# Patient Record
Sex: Male | Born: 1975 | Race: White | Hispanic: No | Marital: Single | Smoking: Current some day smoker
Health system: Southern US, Community
[De-identification: ages and names within clinical notes are randomized; demographics above are authoritative.]

## PROBLEM LIST (undated history)

## (undated) DIAGNOSIS — I10 Essential (primary) hypertension: Secondary | ICD-10-CM

## (undated) DIAGNOSIS — C801 Malignant (primary) neoplasm, unspecified: Secondary | ICD-10-CM

## (undated) DIAGNOSIS — F32A Depression, unspecified: Secondary | ICD-10-CM

## (undated) DIAGNOSIS — T4145XA Adverse effect of unspecified anesthetic, initial encounter: Secondary | ICD-10-CM

## (undated) DIAGNOSIS — E2740 Unspecified adrenocortical insufficiency: Secondary | ICD-10-CM

## (undated) DIAGNOSIS — R112 Nausea with vomiting, unspecified: Secondary | ICD-10-CM

## (undated) DIAGNOSIS — C649 Malignant neoplasm of unspecified kidney, except renal pelvis: Secondary | ICD-10-CM

## (undated) DIAGNOSIS — F329 Major depressive disorder, single episode, unspecified: Secondary | ICD-10-CM

## (undated) DIAGNOSIS — Z9889 Other specified postprocedural states: Secondary | ICD-10-CM

## (undated) DIAGNOSIS — K219 Gastro-esophageal reflux disease without esophagitis: Secondary | ICD-10-CM

## (undated) DIAGNOSIS — G4730 Sleep apnea, unspecified: Secondary | ICD-10-CM

## (undated) DIAGNOSIS — E119 Type 2 diabetes mellitus without complications: Secondary | ICD-10-CM

## (undated) DIAGNOSIS — T8859XA Other complications of anesthesia, initial encounter: Secondary | ICD-10-CM

## (undated) HISTORY — DX: Depression, unspecified: F32.A

## (undated) HISTORY — PX: CHOLECYSTECTOMY: SHX55

## (undated) HISTORY — DX: Major depressive disorder, single episode, unspecified: F32.9

## (undated) HISTORY — DX: Unspecified adrenocortical insufficiency: E27.40

---

## 2002-05-06 ENCOUNTER — Encounter: Payer: Self-pay | Admitting: Family Medicine

## 2002-05-06 ENCOUNTER — Encounter: Admission: RE | Admit: 2002-05-06 | Discharge: 2002-05-06 | Payer: Self-pay | Admitting: Family Medicine

## 2009-11-30 ENCOUNTER — Encounter: Admission: RE | Admit: 2009-11-30 | Discharge: 2009-11-30 | Payer: Self-pay | Admitting: Family Medicine

## 2011-12-14 ENCOUNTER — Inpatient Hospital Stay: Payer: Self-pay | Admitting: Surgery

## 2011-12-14 LAB — COMPREHENSIVE METABOLIC PANEL
Albumin: 4.2 g/dL (ref 3.4–5.0)
Alkaline Phosphatase: 65 U/L (ref 50–136)
Anion Gap: 6 — ABNORMAL LOW (ref 7–16)
BUN: 11 mg/dL (ref 7–18)
Bilirubin,Total: 0.5 mg/dL (ref 0.2–1.0)
Creatinine: 1.03 mg/dL (ref 0.60–1.30)
EGFR (Non-African Amer.): >60
Glucose: 104 mg/dL — ABNORMAL HIGH (ref 65–99)
Osmolality: 281 (ref 275–301)
Potassium: 4.3 mmol/L (ref 3.5–5.1)
Sodium: 141 mmol/L (ref 136–145)
Total Protein: 7.6 g/dL (ref 6.4–8.2)

## 2011-12-14 LAB — LIPASE, BLOOD: Lipase: 124 U/L (ref 73–393)

## 2011-12-14 LAB — CBC
HGB: 14.7 g/dL (ref 13.0–18.0)
MCHC: 34.1 g/dL (ref 32.0–36.0)
Platelet: 296 10*3/uL (ref 150–440)
RBC: 4.77 10*6/uL (ref 4.40–5.90)

## 2011-12-15 LAB — CBC WITH DIFFERENTIAL/PLATELET
Basophil %: 0.1 %
Eosinophil #: 0 10*3/uL (ref 0.0–0.7)
HCT: 35.7 % — ABNORMAL LOW (ref 40.0–52.0)
HGB: 12.6 g/dL — ABNORMAL LOW (ref 13.0–18.0)
Lymphocyte #: 1 10*3/uL (ref 1.0–3.6)
MCH: 31.4 pg (ref 26.0–34.0)
MCHC: 35.2 g/dL (ref 32.0–36.0)
Monocyte #: 0.6 x10 3/mm (ref 0.2–1.0)
Neutrophil #: 7.4 10*3/uL — ABNORMAL HIGH (ref 1.4–6.5)

## 2011-12-15 LAB — BASIC METABOLIC PANEL
Anion Gap: 7 (ref 7–16)
BUN: 10 mg/dL (ref 7–18)
Chloride: 105 mmol/L (ref 98–107)
Co2: 24 mmol/L (ref 21–32)
Creatinine: 0.88 mg/dL (ref 0.60–1.30)
EGFR (Non-African Amer.): >60
Potassium: 4 mmol/L (ref 3.5–5.1)
Sodium: 136 mmol/L (ref 136–145)

## 2011-12-16 LAB — PATHOLOGY REPORT

## 2011-12-26 ENCOUNTER — Ambulatory Visit: Payer: Self-pay | Admitting: Surgery

## 2011-12-26 LAB — CBC WITH DIFFERENTIAL/PLATELET
Basophil #: 0.1 10*3/uL (ref 0.0–0.1)
Basophil %: 1.3 %
Eosinophil #: 0.3 10*3/uL (ref 0.0–0.7)
HGB: 13.4 g/dL (ref 13.0–18.0)
Lymphocyte %: 23 %
MCHC: 34.6 g/dL (ref 32.0–36.0)
Neutrophil %: 64.4 %
RDW: 12.8 % (ref 11.5–14.5)

## 2014-04-29 NOTE — Discharge Summary (Signed)
PATIENT NAME:  [name], [name] MR#:  [id number] DATE OF BIRTH:  [birth year]  DATE OF ADMISSION:  12/14/2011 DATE OF DISCHARGE:  12/16/2011   BRIEF HISTORY: Shawn Estrada is a 39 year old gentleman admitted with symptomatic biliary tract disease. He came to the Emergency Room with an increase in his symptoms over the last several days. Work-up suggested he may have chronic or possible subacute cholecystitis. He was taken to surgery on the afternoon of December 6th where he underwent laparoscopic cholecystectomy. He did have some bleeding complications at the bed of his liver that required significant dissection to control. A drain was placed and he did well after surgery. He did not have any significant problems post-surgery. He was up, active, tolerating a diet with no complaints. His wounds look good. There is no sign of any infection. His drain was removed. He is discharged home today to be followed in the office in 7 to 10 days' time. We have cautioned him about returning to work until he has followed up in the office. He was given Norco 5/325 p.o. q.4 to 6 hours p.r.n. pain.   FINAL DISCHARGE DIAGNOSIS: Acute cholecystitis.   PROCEDURE: Laparoscopic cholecystectomy.   ____________________________ Rodena Goldmann III, MD rle:drc D: 12/16/2011 10:20:00 ET T: 12/16/2011 12:43:53 ET JOB#: [id number]  cc: Rodena Goldmann III, MD, <Dictator> Rodena Goldmann MD ELECTRONICALLY SIGNED 12/23/2011 16:58

## 2014-04-29 NOTE — H&P (Signed)
Subjective/Chief Complaint Epigastric and RUQ pain x 6 hours, emesis x 3 times    History of Present Illness Mr. Toso is a healthy 39 yo M with a history of acute onset epigastric and RUQ pain. He says that his pain began suddenly approx 10 pm.  He describes it as colicky and intermittent.  Has never had pain like this before.  Has had some episodes of emesis which were secondary to pain.  Father had gallstone disease and had to have his gallbladder out.  Last bm today at 8 pm and was normal.  No fevers, chills, night sweats, shortness of breath, cough, chest pain, dysuria/hematuria.  Ultrasound shows multiple gallstones, nonmobile in neck of gallbladder, no gb wall thickening, no pericholecystic fluid, cbd nl at 5.4 mm    Past History History of oral surgery   Past Med/Surgical Hx:  denies medical/surgical history:   ALLERGIES:  No Known Allergies:   Family and Social History:   Family History Negative  F/H of gallstones.  No CAD/DM/HTN    Social History positive  tobacco, positive ETOH, negative Illicit drugs, Social Etoh, last 2 beers 2 days PTA    + Tobacco Current (within 1 year)  Used smokeless tobacco daily    Place of Living Home  Here with his girlfriend, lives in [place]   Review of Systems:   Subjective/Chief Complaint colicky RUQ/epigastric pain    Fever/Chills No    Cough No    Sputum No    Abdominal Pain Yes    Diarrhea No    Constipation No    Nausea/Vomiting Yes    SOB/DOE No    Chest Pain No    Dysuria No    Medications/Allergies Reviewed Medications/Allergies reviewed   Physical Exam:   GEN no acute distress, Alert and oriented x 3    HEENT pink conjunctivae, PERRL, hearing intact to voice, moist oral mucosa, good dentition    NECK supple  No masses    RESP normal resp effort  clear BS  no use of accessory muscles    CARD regular rate  no murmur  no thrills  No LE edema    ABD denies tenderness  denies Flank Tenderness  no  liver/spleen enlargement  no hernia  soft  normal BS  no Adominal Mass    EXTR negative cyanosis/clubbing, negative edema    SKIN normal to palpation, No rashes, No ulcers, skin turgor good    NEURO cranial nerves intact, negative rigidity, negative tremor, follows commands, strength:, motor/sensory function intact    PSYCH alert, A+O to time, place, person, good insight   Lab Results: Hepatic:  04-Dec-13 02:12    Bilirubin, Total 0.5   Alkaline Phosphatase 65   SGPT (ALT) 35   SGOT (AST) 24   Total Protein, Serum 7.6   Albumin, Serum 4.2  Routine Chem:  04-Dec-13 02:12    Glucose, Serum  104   BUN 11   Creatinine (comp) 1.03   Sodium, Serum 141   Potassium, Serum 4.3   Chloride, Serum 107   CO2, Serum 28   Calcium (Total), Serum 9.2   Osmolality (calc) 281   eGFR (African American) >60   eGFR (Non-African American) >60 (eGFR values <33m/min/1.73 m2 may be an indication of chronic kidney disease (CKD). Calculated eGFR is useful in patients with stable renal function. The eGFR calculation will not be reliable in acutely ill patients when serum creatinine is changing rapidly. It is not useful in  patients  on dialysis. The eGFR calculation may not be applicable to patients at the low and high extremes of body sizes, pregnant women, and vegetarians.)   Anion Gap  6   Lipase 124 (Result(s) reported on 14 Dec 2011 at 02:45AM.)  Routine Hem:  04-Dec-13 02:12    WBC (CBC) 7.7   RBC (CBC) 4.77   Hemoglobin (CBC) 14.7   Hematocrit (CBC) 43.1   Platelet Count (CBC) 296 (Result(s) reported on 14 Dec 2011 at 02:35AM.)   MCV 91   MCH 30.8   MCHC 34.1   RDW 12.8     Assessment/Admission Diagnosis Mr. Slape is a pleasant 39 yo M with colicky RUQ and epigastric pain.  Gallstones on ultrasound with nonmobile stones at neck.  Likely biliary colic.    Plan Will admit for pain control.  Will discuss possible cholecystectomy with my colleague, Dr. Pat Patrick.   Electronic  Signatures: Floyde Parkins (MD)  (Signed 04-Dec-13 04:17)  Authored: CHIEF COMPLAINT and HISTORY, PAST MEDICAL/SURGIAL HISTORY, ALLERGIES, FAMILY AND SOCIAL HISTORY, REVIEW OF SYSTEMS, PHYSICAL EXAM, LABS, ASSESSMENT AND PLAN   Last Updated: 04-Dec-13 04:17 by Floyde Parkins (MD)

## 2014-04-29 NOTE — Op Note (Signed)
PATIENT NAME:  Shawn Estrada, Shawn Estrada MR#:  [id number] DATE OF BIRTH:  22-Jun-1975  DATE OF PROCEDURE:  12/14/2011  PREOPERATIVE DIAGNOSIS: Chronic cholecystitis and cholelithiasis.   POSTOPERATIVE DIAGNOSIS: Chronic cholecystitis and cholelithiasis.   PROCEDURE: Laparoscopic cholecystectomy.   SURGEON: Rodena Goldmann III, MD  ASSISTANT: Terence Lux, PA student   ANESTHESIA: General.   OPERATIVE PROCEDURE: With the patient in supine position after induction of appropriate general anesthesia, the patient's abdomen was prepped with ChloraPrep and draped with sterile towels. The patient was placed in head down, feet up position. A small infraumbilical incision was made in the standard fashion, carried down bluntly through the subcutaneous tissue. The Veress needle was used to cannulate the peritoneal cavity. CO2 was insufflated to appropriate pressure measurements. When approximately 2.5 liters of CO2 were instilled Veress needle was withdrawn. An 11 mm [employer] port inserted into the peritoneal cavity. Intraperitoneal position was confirmed. CO2 was reinsufflated. The patient was placed head up, feet down position, rotated slightly to the left side. A subxiphoid transverse incision was made and an 11 mm port inserted under direct vision. Two lateral ports 5 mm in size inserted under direct vision. The gallbladder was contracted, thickened, obviously chronically inflamed with evidence of edema and acute inflammation in the inferior portion. Gallbladder was retracted superiorly and laterally exposing the hepatoduodenal ligament. Cystic artery and cystic duct were identified. Cystic duct was small with a large adjacent pericystic artery duct. The duct was clipped proximally and opened. An on table cholangiogram could not be performed because the catheter could not be inserted past the valves into the distal duct. Anatomy was confirmed. Cystic duct doubly clipped on the common duct side and divided. Cystic artery  was identified, doubly clipped and divided. The gallbladder was dissected free from its bed and delivered. I got into the gallbladder twice both times capturing large marble size stones and removing them. No stones were lost. The midportion of the gallbladder, there was a large arterial branch which bled profusely. Took several minutes to affect control. It was clipped and controlled with a moderate amount of difficulty but satisfactorily. The gallbladder was continued to be removed using Bovie electrocautery. It was captured with an Endo Catch apparatus, removed through subxiphoid port. The area was copiously irrigated. A Blake drain was placed at the base of the liver, brought out through a separate stab wound, secured with 3-0 nylon. The abdomen was desufflated. The incision had been closed with a suture passer and 0 Vicryl. Skin was closed with 5-0 and 3-0 nylon and sterile dressings applied. The patient returned to the recovery room having tolerated procedure well. Sponge, instrument, and needle counts were correct x2 in the Operating Room.   ____________________________ Rodena Goldmann III, MD rle:cms D: 12/14/2011 16:52:24 ET T: 12/14/2011 17:51:05 ET JOB#: [id number]  cc: Rodena Goldmann III, MD, <Dictator> Rodena Goldmann MD ELECTRONICALLY SIGNED 12/15/2011 17:10

## 2014-09-26 ENCOUNTER — Ambulatory Visit (INDEPENDENT_AMBULATORY_CARE_PROVIDER_SITE_OTHER): Payer: 59 | Admitting: Licensed Clinical Social Worker

## 2014-09-26 DIAGNOSIS — F39 Unspecified mood [affective] disorder: Secondary | ICD-10-CM
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
## 2014-10-24 ENCOUNTER — Ambulatory Visit (INDEPENDENT_AMBULATORY_CARE_PROVIDER_SITE_OTHER): Payer: 59 | Admitting: Psychiatry

## 2014-10-24 ENCOUNTER — Encounter: Payer: Self-pay | Admitting: Psychiatry

## 2014-10-24 VITALS — BP 140/84 | HR 96 | Temp 98.4°F | Ht 74.0 in | Wt 261.6 lb

## 2014-10-24 DIAGNOSIS — F603 Borderline personality disorder: Secondary | ICD-10-CM | POA: Diagnosis not present

## 2014-10-24 NOTE — Patient Instructions (Addendum)
Will work on referral to anger management and clinic will contact and discuss this with you.
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
## 2014-11-07 ENCOUNTER — Ambulatory Visit (INDEPENDENT_AMBULATORY_CARE_PROVIDER_SITE_OTHER): Payer: 59 | Admitting: Licensed Clinical Social Worker

## 2014-11-07 DIAGNOSIS — F603 Borderline personality disorder: Secondary | ICD-10-CM | POA: Diagnosis not present

## 2014-11-12 NOTE — Progress Notes (Signed)
   THERAPIST PROGRESS NOTE  Session Time: 68min  Participation Level: Active  Behavioral Response: CasualAlertAppropriate  Type of Therapy: Individual Therapy  Treatment Goals addressed: Coping  Interventions: CBT, Motivational Interviewing, Solution Focused, Supportive, Family Systems and Reframing  Summary: Shawn Estrada is a 39 y.o. male who presents with continued symptoms of his diagnosis. He reports that he spoke with friends and family to learn about his anger and aggression.  He states that his family reports that his anger has reduced over the years.  Explored coping skills and communication styles.  Explored anger management groups.  Suicidal/Homicidal: Nowithout intent/plan  Therapist Response: Assessed mood and explored stressors.  Explored coping skills and assisted with understanding them.  Plan: Return again in 4 weeks.  Diagnosis: Axis I: Explosive Personality Disorder    Axis II: No diagnosis    Lubertha South 11/12/2014

## 2014-12-03 ENCOUNTER — Ambulatory Visit: Payer: 59 | Admitting: Psychiatry

## 2014-12-10 ENCOUNTER — Ambulatory Visit: Payer: 59 | Admitting: Psychiatry

## 2014-12-12 ENCOUNTER — Ambulatory Visit: Payer: Self-pay | Admitting: Licensed Clinical Social Worker

## 2015-01-30 ENCOUNTER — Ambulatory Visit (INDEPENDENT_AMBULATORY_CARE_PROVIDER_SITE_OTHER): Payer: 59 | Admitting: Licensed Clinical Social Worker

## 2015-01-30 ENCOUNTER — Encounter: Payer: Self-pay | Admitting: Psychiatry

## 2015-01-30 ENCOUNTER — Ambulatory Visit (INDEPENDENT_AMBULATORY_CARE_PROVIDER_SITE_OTHER): Payer: 59 | Admitting: Psychiatry

## 2015-01-30 VITALS — BP 140/88 | HR 104 | Temp 97.4°F | Ht 74.0 in | Wt 260.0 lb

## 2015-01-30 DIAGNOSIS — F603 Borderline personality disorder: Secondary | ICD-10-CM

## 2015-01-30 NOTE — Progress Notes (Signed)
BH MD/PA/NP OP Progress Note  01/30/2015 8:58 AM Shawn Estrada  MRN:  [id number]  Subjective:  Asian returns for follow-up of his intermittent explosive disorder. He states overall things are been going well. He states that he has been catching himself in certain situations where he would get angry (i.e.) is with his girlfriend) ease and walking away. He states is explained her that he will need to do this in certain situations. He states that overall his work well. We spent time discussing that there is no FDA approved medicine for anger, however there have been some studies that have shown SSRIs or mood stabilizers (i.e. Carbamazepine) might be effective. Ever the main treatment would be therapy. Patient is not interested in medications and would like to avoid them. Given that he feels like he is made some behavioral interventions which have been helpful to defer starting any medications at this time. He is aware that should the therapy not be effective or his behaviors worsen he can always come back and try medication.  He states he is able to enjoy things but has been working a lot of hours at work recently to being on call. Discusses drinking and he states it's minimal to none. He states his last time drinking was New Year's Eve when he had a few beers and champagne. Chief Complaint: Doing okay Chief Complaint    Follow-up     Visit Diagnosis:  No diagnosis found.  Past Medical History:  Past Medical History  Diagnosis Date  . Anxiety   . Depression     Past Surgical History  Procedure Laterality Date  . Cholecystectomy     Family History:  Family History  Problem Relation Age of Onset  . Depression Sister    Social History:  Social History   Social History  . Marital Status: Married    Spouse Name: N/A  . Number of Children: N/A  . Years of Education: N/A   Social History Main Topics  . Smoking status: Current Some Day Smoker -- 0.50 packs/day    Types: Cigarettes     Start date: 10/23/1989  . Smokeless tobacco: Current User    Types: Snuff  . Alcohol Use: No  . Drug Use: No  . Sexual Activity: Yes    Birth Control/ Protection: None   Other Topics Concern  . None   Social History Narrative   Additional History:   Assessment:   Musculoskeletal: Strength & Muscle Tone: within normal limits Gait & Station: normal Patient leans: N/A  Psychiatric Specialty Exam: HPI  Review of Systems  Psychiatric/Behavioral: Negative for depression, suicidal ideas, hallucinations, memory loss and substance abuse. The patient is not nervous/anxious and does not have insomnia.   All other systems reviewed and are negative.   Blood pressure 140/88, pulse 104, temperature 97.4 F (36.3 C), temperature source Tympanic, height 6\' 2"  (1.88 m), weight 260 lb (117.935 kg), SpO2 96 %.Body mass index is 33.37 kg/(m^2).  General Appearance: Well Groomed  Eye Contact:  Good  Speech:  Normal Rate  Volume:  Normal  Mood:  Good  Affect:  Congruent  Thought Process:  Linear and Logical  Orientation:  Full (Time, Place, and Person)  Thought Content:  Negative  Suicidal Thoughts:  No  Homicidal Thoughts:  No  Memory:  Immediate;   Good Recent;   Good Remote;   Good  Judgement:  Good  Insight:  Good  Psychomotor Activity:  Normal  Concentration:  Good  Recall:  Good  Fund of Knowledge: Good  Language: Good  Akathisia:  No  Handed:    AIMS (if indicated):    Assets:  Communication Skills Desire for Improvement Social Support Vocational/Educational  ADL's:  Intact  Cognition: WNL  Sleep:  Good    Is the patient at risk to self?  No. Has the patient been a risk to self in the past 6 months?  No. Has the patient been a risk to self within the distant past?  No. Is the patient a risk to others?  No. Has the patient been a risk to others in the past 6 months?  No. Has the patient been a risk to others within the distant past?  No.  Current  Medications: Current Outpatient Prescriptions  Medication Sig Dispense Refill  . omeprazole (PRILOSEC) 20 MG capsule Take 20 mg by mouth daily.     No current facility-administered medications for this visit.    Medical Decision Making:  Established Problem, Stable/Improving (1)  Treatment Plan Summary:Medication management and Plan   Intermittent Explosive Disorder -patient continues to function on his job and has not had any issues with harm to self or others. He states most recently he's made an effort to be aware of his feelings and walk away if he feels like his emotions are escalating to anger. He is continuing in therapy at this time. He would prefer not to start any medications and given he discusses he has been able to use behavioral intervention to stop his anger we are not going to start medication at this time.Rene Paci made him aware of some options such as SSRIs should he feel like he wants medications in addition to therapy.   Faith Rogue 01/30/2015, 8:58 AM

## 2015-02-09 NOTE — Progress Notes (Signed)
   THERAPIST PROGRESS NOTE  Session Time:34min  Participation Level: Active  Behavioral Response: CasualAlertAppropriate  Type of Therapy: Individual Therapy  Treatment Goals addressed: Coping  Interventions: CBT, Motivational Interviewing, Solution Focused, Supportive and Reframing  Summary: Shawn Estrada is a 40 y.o. male who presents with a reduction in his current symptoms.  Explored his behavior and aggressive incidents the past few months.  Discussion of stressors that he has noticed in the past few months. Explored coping skills that have been useful.  Evaluated coping skills used and explored new techniques.  Discussed imagery and developed coping skills to use if someone follows him as he attempts to walk away.  Suicidal/Homicidal: Nowithout intent/plan  Therapist Response: LCSW provided Patient with ongoing emotional support and encouragement.  Normalized his feelings.  Commended Patient on his progress and reinforced the importance of client staying focused on his own strengths and resources and resiliency. Processed various strategies for dealing with stressors.    Plan: Return again PRN.  Diagnosis: Axis I: Explosive Personality Disorder    Axis II: No diagnosis    Lubertha South 01/30/2015

## 2015-08-31 ENCOUNTER — Ambulatory Visit
Admission: RE | Admit: 2015-08-31 | Discharge: 2015-08-31 | Disposition: A | Discharge status: Home / Self Care | Payer: Commercial Managed Care - HMO | Source: Ambulatory Visit | Attending: Physician Assistant | Admitting: Physician Assistant

## 2015-08-31 ENCOUNTER — Other Ambulatory Visit: Payer: Self-pay | Admitting: Physician Assistant

## 2015-08-31 DIAGNOSIS — R0602 Shortness of breath: Secondary | ICD-10-CM

## 2015-09-30 ENCOUNTER — Encounter: Payer: Self-pay | Admitting: Physician Assistant

## 2015-10-01 ENCOUNTER — Encounter (INDEPENDENT_AMBULATORY_CARE_PROVIDER_SITE_OTHER): Payer: Commercial Managed Care - HMO

## 2015-10-01 DIAGNOSIS — R002 Palpitations: Secondary | ICD-10-CM

## 2015-12-23 ENCOUNTER — Ambulatory Visit (INDEPENDENT_AMBULATORY_CARE_PROVIDER_SITE_OTHER): Payer: 59 | Admitting: Licensed Clinical Social Worker

## 2015-12-23 DIAGNOSIS — F321 Major depressive disorder, single episode, moderate: Secondary | ICD-10-CM | POA: Diagnosis not present

## 2015-12-24 ENCOUNTER — Telehealth: Payer: Self-pay

## 2015-12-24 NOTE — Telephone Encounter (Signed)
I returned his call.  His appointment is Tuesday the 19th with Dr. Carolynn Sayers

## 2015-12-24 NOTE — Progress Notes (Signed)
   THERAPIST PROGRESS NOTE  Session Time: 60 min  Participation Level: Active  Behavioral Response: Casual and NeatAlertDepressed  Type of Therapy: Individual Therapy  Treatment Goals addressed: Coping  Interventions: CBT, Motivational Interviewing, Solution Focused, Strength-based, Supportive and Reframing  Summary: Shawn Estrada is a 40 y.o. male who presents with an increase in his mood/behavior.   Patient reports that he has not been doing well emotionally. Patient reports continuous change in his mood, anger and emotions since his last session, several months ago.  Patient states that he and his girlfriend broke up about 2 months ago.  Patient states that he has tried to reduce anger outburst by walking away and telling himself it's not worth it.  Patient reports that he was provoked at work and he followed the chain of command.  Patient states that nothing happened were no changes were made in his work environment.  Patient states that he isolates is tearful, isolates self, lack of motivation, inability to sleep and fatigue.  Discussion of an appointment with a psychiatrist and possible referral to mental health intensive outpatient program in [place].  Patient reports that he would like to attend the intensive outpatient program, but works Monday through Friday 830 to 5:30. Discussion of coping skills and relaxation techniques.  Suicidal/Homicidal: No  Therapist Response: Assessed pt current functioning per pt report.  Focused on rapport building w/ pt and exploring w/ pt his tx hx and what he is hoping to accomplish in treatment.  Discussed current symptoms and focused on pt utilizing his strengths w/ typical struggles w/ winter months ahead.  Referral to [facility] & Dr. Adele Schilder.  Plan: After MH IOP.  Diagnosis: Axis I: Depression    Axis II: No diagnosis    Lubertha South, LCSW 12/24/2015

## 2015-12-24 NOTE — Telephone Encounter (Signed)
pt would like to find out if you got him set up with someone for medication managment.

## 2015-12-28 ENCOUNTER — Ambulatory Visit: Payer: 59 | Admitting: Licensed Clinical Social Worker

## 2015-12-29 ENCOUNTER — Encounter (HOSPITAL_COMMUNITY): Payer: Self-pay | Admitting: Psychiatry

## 2015-12-29 ENCOUNTER — Ambulatory Visit (INDEPENDENT_AMBULATORY_CARE_PROVIDER_SITE_OTHER): Payer: Commercial Managed Care - HMO | Admitting: Psychiatry

## 2015-12-29 VITALS — BP 146/92 | HR 86 | Ht 74.0 in | Wt 235.6 lb

## 2015-12-29 DIAGNOSIS — F331 Major depressive disorder, recurrent, moderate: Secondary | ICD-10-CM

## 2015-12-29 DIAGNOSIS — Z79899 Other long term (current) drug therapy: Secondary | ICD-10-CM | POA: Diagnosis not present

## 2015-12-29 MED ORDER — LAMOTRIGINE 25 MG PO TABS: ORAL_TABLET | ORAL | 1 refills | Status: DC
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
## 2016-01-13 ENCOUNTER — Ambulatory Visit (INDEPENDENT_AMBULATORY_CARE_PROVIDER_SITE_OTHER): Payer: 59 | Admitting: Licensed Clinical Social Worker

## 2016-01-13 DIAGNOSIS — F331 Major depressive disorder, recurrent, moderate: Secondary | ICD-10-CM

## 2016-01-14 ENCOUNTER — Ambulatory Visit: Payer: 59 | Admitting: Psychiatry

## 2016-01-21 NOTE — Progress Notes (Signed)
   THERAPIST PROGRESS NOTE  Session Time: 59min  Participation Level: Active  Behavioral Response: NeatAlertDepressed  Type of Therapy: Individual Therapy  Treatment Goals addressed: Coping  Interventions: CBT, Motivational Interviewing, Solution Focused, Strength-based, Supportive and Reframing  Summary: Shawn Estrada is a 41 y.o. male who presents with continued symptoms of his diagnosis.  Patient reports that he is doing poor with his mood.  Patient reports that he isolates himself and gets upsert (crying, sadness). When he thinks about his previous girlfriend.  Patient reports that he goes to work and goes home daily.  He reports that he was able to visit with his girlfriend for a few days and now she wants him to leave her home.  Patient is fixated on discussing his relationship and how things went wrong.  Patient reports that he wants the relationship.  Discussed pros and cons of the relationship and life after the relationship.  Assisted with developing coping skills to handle current mood.   Suicidal/Homicidal: No  Therapist Response: Assessed pt current functioning per pt report.  Explored w/ pt contributing factors to increased depressed mood.  Discussed current symptoms and focused on pt utilizing his strengths w/ typical..  Developed tx plan   Plan: Return again in 2 weeks.  Diagnosis: Axis I: Major Depression, Recurrent severe    Axis II: No diagnosis    Lubertha South, LCSW 01/15/16

## 2016-01-29 ENCOUNTER — Ambulatory Visit: Payer: 59 | Admitting: Licensed Clinical Social Worker

## 2016-02-05 ENCOUNTER — Ambulatory Visit: Payer: 59 | Admitting: Licensed Clinical Social Worker

## 2016-02-12 ENCOUNTER — Encounter (HOSPITAL_COMMUNITY): Payer: Self-pay | Admitting: Psychiatry

## 2016-02-12 ENCOUNTER — Ambulatory Visit (INDEPENDENT_AMBULATORY_CARE_PROVIDER_SITE_OTHER): Payer: Commercial Managed Care - HMO | Admitting: Psychiatry

## 2016-02-12 VITALS — BP 150/96 | HR 97 | Ht 74.0 in | Wt 235.0 lb

## 2016-02-12 DIAGNOSIS — Z818 Family history of other mental and behavioral disorders: Secondary | ICD-10-CM

## 2016-02-12 DIAGNOSIS — F17210 Nicotine dependence, cigarettes, uncomplicated: Secondary | ICD-10-CM

## 2016-02-12 DIAGNOSIS — Z9049 Acquired absence of other specified parts of digestive tract: Secondary | ICD-10-CM | POA: Diagnosis not present

## 2016-02-12 DIAGNOSIS — F331 Major depressive disorder, recurrent, moderate: Secondary | ICD-10-CM | POA: Diagnosis not present

## 2016-02-12 DIAGNOSIS — Z79899 Other long term (current) drug therapy: Secondary | ICD-10-CM

## 2016-02-12 MED ORDER — LAMOTRIGINE 100 MG PO TABS: 100.0000 mg | ORAL_TABLET | Freq: Every day | ORAL | 1 refills | Status: DC
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
## 2016-02-18 DIAGNOSIS — N451 Epididymitis: Secondary | ICD-10-CM | POA: Diagnosis not present

## 2016-02-24 ENCOUNTER — Other Ambulatory Visit (HOSPITAL_COMMUNITY): Payer: Self-pay | Admitting: Psychiatry

## 2016-02-24 DIAGNOSIS — F331 Major depressive disorder, recurrent, moderate: Secondary | ICD-10-CM

## 2016-02-26 ENCOUNTER — Ambulatory Visit (INDEPENDENT_AMBULATORY_CARE_PROVIDER_SITE_OTHER): Payer: 59 | Admitting: Licensed Clinical Social Worker

## 2016-02-26 DIAGNOSIS — F331 Major depressive disorder, recurrent, moderate: Secondary | ICD-10-CM | POA: Diagnosis not present

## 2016-03-04 ENCOUNTER — Other Ambulatory Visit (HOSPITAL_COMMUNITY): Payer: Self-pay | Admitting: Psychiatry

## 2016-03-04 DIAGNOSIS — Z79899 Other long term (current) drug therapy: Secondary | ICD-10-CM | POA: Diagnosis not present
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
## 2016-03-05 LAB — CBC WITH DIFFERENTIAL/PLATELET
Basophils Absolute: 0.1 10*3/uL (ref 0.0–0.2)
Basos: 1 %
EOS (ABSOLUTE): 0.2 10*3/uL (ref 0.0–0.4)
EOS: 3 %
HEMATOCRIT: 42.6 % (ref 37.5–51.0)
Hemoglobin: 14.3 g/dL (ref 13.0–17.7)
Immature Grans (Abs): 0 10*3/uL (ref 0.0–0.1)
Immature Granulocytes: 0 %
LYMPHS ABS: 2 10*3/uL (ref 0.7–3.1)
Lymphs: 37 %
MCH: 29.1 pg (ref 26.6–33.0)
MCHC: 33.6 g/dL (ref 31.5–35.7)
MCV: 87 fL (ref 79–97)
MONOS ABS: 0.3 10*3/uL (ref 0.1–0.9)
Monocytes: 6 %
Neutrophils Absolute: 2.9 10*3/uL (ref 1.4–7.0)
Neutrophils: 53 %
Platelets: 279 10*3/uL (ref 150–379)
RBC: 4.91 x10E6/uL (ref 4.14–5.80)
RDW: 13 % (ref 12.3–15.4)
WBC: 5.4 10*3/uL (ref 3.4–10.8)

## 2016-03-05 LAB — CMP14+EGFR
A/G RATIO: 1.8 (ref 1.2–2.2)
ALBUMIN: 4.6 g/dL (ref 3.5–5.5)
ALK PHOS: 77 IU/L (ref 39–117)
ALT: 17 IU/L (ref 0–44)
AST: 17 IU/L (ref 0–40)
BILIRUBIN TOTAL: 0.4 mg/dL (ref 0.0–1.2)
BUN / CREAT RATIO: 10 (ref 9–20)
BUN: 11 mg/dL (ref 6–24)
CO2: 22 mmol/L (ref 18–29)
CREATININE: 1.14 mg/dL (ref 0.76–1.27)
Calcium: 9.1 mg/dL (ref 8.7–10.2)
Chloride: 103 mmol/L (ref 96–106)
GFR calc Af Amer: 91 mL/min/{1.73_m2} (ref >59)
GFR calc non Af Amer: 79 mL/min/{1.73_m2} (ref >59)
GLOBULIN, TOTAL: 2.6 g/dL (ref 1.5–4.5)
Glucose: 82 mg/dL (ref 65–99)
POTASSIUM: 4.5 mmol/L (ref 3.5–5.2)
SODIUM: 142 mmol/L (ref 134–144)
Total Protein: 7.2 g/dL (ref 6.0–8.5)

## 2016-03-05 LAB — TSH: TSH: 0.81 u[IU]/mL (ref 0.450–4.500)

## 2016-03-05 LAB — HGB A1C W/O EAG: Hgb A1c MFr Bld: 5.2 % (ref 4.8–5.6)

## 2016-03-18 ENCOUNTER — Ambulatory Visit (INDEPENDENT_AMBULATORY_CARE_PROVIDER_SITE_OTHER): Payer: 59 | Admitting: Licensed Clinical Social Worker

## 2016-03-18 DIAGNOSIS — F331 Major depressive disorder, recurrent, moderate: Secondary | ICD-10-CM
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
## 2016-03-26 ENCOUNTER — Emergency Department: Payer: 59

## 2016-03-26 ENCOUNTER — Emergency Department
Admission: EM | Admit: 2016-03-26 | Discharge: 2016-03-26 | Disposition: A | Discharge status: Home / Self Care | Payer: 59 | Attending: Emergency Medicine | Admitting: Emergency Medicine

## 2016-03-26 ENCOUNTER — Encounter: Payer: Self-pay | Admitting: Medical Oncology

## 2016-03-26 DIAGNOSIS — R109 Unspecified abdominal pain: Secondary | ICD-10-CM | POA: Diagnosis not present

## 2016-03-26 DIAGNOSIS — F17210 Nicotine dependence, cigarettes, uncomplicated: Secondary | ICD-10-CM | POA: Diagnosis not present

## 2016-03-26 DIAGNOSIS — I10 Essential (primary) hypertension: Secondary | ICD-10-CM | POA: Insufficient documentation

## 2016-03-26 DIAGNOSIS — Z79899 Other long term (current) drug therapy: Secondary | ICD-10-CM | POA: Insufficient documentation

## 2016-03-26 DIAGNOSIS — F17290 Nicotine dependence, other tobacco product, uncomplicated: Secondary | ICD-10-CM | POA: Insufficient documentation

## 2016-03-26 DIAGNOSIS — R110 Nausea: Secondary | ICD-10-CM | POA: Diagnosis not present

## 2016-03-26 DIAGNOSIS — R918 Other nonspecific abnormal finding of lung field: Secondary | ICD-10-CM | POA: Diagnosis not present

## 2016-03-26 DIAGNOSIS — R339 Retention of urine, unspecified: Secondary | ICD-10-CM | POA: Diagnosis not present

## 2016-03-26 DIAGNOSIS — R1031 Right lower quadrant pain: Secondary | ICD-10-CM | POA: Diagnosis not present

## 2016-03-26 DIAGNOSIS — N2889 Other specified disorders of kidney and ureter: Secondary | ICD-10-CM | POA: Insufficient documentation

## 2016-03-26 DIAGNOSIS — R319 Hematuria, unspecified: Secondary | ICD-10-CM | POA: Diagnosis present

## 2016-03-26 HISTORY — DX: Essential (primary) hypertension: I10

## 2016-03-26 LAB — CBC WITH DIFFERENTIAL/PLATELET
Basophils Absolute: 0.1 10*3/uL (ref 0–0.1)
Basophils Relative: 1 %
Eosinophils Absolute: 0.2 10*3/uL (ref 0–0.7)
Eosinophils Relative: 3 %
HEMATOCRIT: 42.5 % (ref 40.0–52.0)
HEMOGLOBIN: 14.6 g/dL (ref 13.0–18.0)
LYMPHS ABS: 1.9 10*3/uL (ref 1.0–3.6)
Lymphocytes Relative: 23 %
MCH: 29.8 pg (ref 26.0–34.0)
MCHC: 34.4 g/dL (ref 32.0–36.0)
MCV: 86.8 fL (ref 80.0–100.0)
MONOS PCT: 8 %
Monocytes Absolute: 0.7 10*3/uL (ref 0.2–1.0)
NEUTROS ABS: 5.4 10*3/uL (ref 1.4–6.5)
NEUTROS PCT: 65 %
Platelets: 328 10*3/uL (ref 150–440)
RBC: 4.9 MIL/uL (ref 4.40–5.90)
RDW: 13.1 % (ref 11.5–14.5)
WBC: 8.3 10*3/uL (ref 3.8–10.6)

## 2016-03-26 LAB — BASIC METABOLIC PANEL
Anion gap: 9 (ref 5–15)
BUN: 14 mg/dL (ref 6–20)
CHLORIDE: 102 mmol/L (ref 101–111)
CO2: 26 mmol/L (ref 22–32)
CREATININE: 1.33 mg/dL — AB (ref 0.61–1.24)
Calcium: 9.1 mg/dL (ref 8.9–10.3)
GFR calc non Af Amer: >60 mL/min (ref >60)
GLUCOSE: 103 mg/dL — AB (ref 65–99)
Potassium: 4.1 mmol/L (ref 3.5–5.1)
Sodium: 137 mmol/L (ref 135–145)

## 2016-03-26 LAB — URINALYSIS, COMPLETE (UACMP) WITH MICROSCOPIC
BACTERIA UA: NONE SEEN
SPECIFIC GRAVITY, URINE: 1.02 (ref 1.005–1.030)
Squamous Epithelial / HPF: NONE SEEN

## 2016-03-26 MED ORDER — SODIUM CHLORIDE 0.9 % IV BOLUS (SEPSIS)
1000.0000 mL | Freq: Once | INTRAVENOUS | Status: AC
  Administered 2016-03-26: 1000 mL via INTRAVENOUS

## 2016-03-26 MED ORDER — CIPROFLOXACIN HCL 500 MG PO TABS
500.0000 mg | ORAL_TABLET | Freq: Once | ORAL | Status: AC
  Administered 2016-03-26: 500 mg via ORAL
  Filled 2016-03-26: qty 1

## 2016-03-26 MED ORDER — CIPROFLOXACIN HCL 500 MG PO TABS: 500.0000 mg | ORAL_TABLET | Freq: Two times a day (BID) | ORAL | 0 refills | Status: DC

## 2016-03-26 MED ORDER — MORPHINE SULFATE (PF) 4 MG/ML IV SOLN
4.0000 mg | Freq: Once | INTRAVENOUS | Status: AC
  Administered 2016-03-26: 4 mg via INTRAVENOUS

## 2016-03-26 MED ORDER — MORPHINE SULFATE (PF) 4 MG/ML IV SOLN
INTRAVENOUS | Status: AC
  Filled 2016-03-26: qty 1

## 2016-03-26 MED ORDER — OXYCODONE-ACETAMINOPHEN 5-325 MG PO TABS: 1.0000 | ORAL_TABLET | Freq: Four times a day (QID) | ORAL | 0 refills | Status: DC | PRN

## 2016-03-26 MED ORDER — OXYCODONE-ACETAMINOPHEN 5-325 MG PO TABS
2.0000 | ORAL_TABLET | Freq: Once | ORAL | Status: AC
  Administered 2016-03-26: 2 via ORAL
  Filled 2016-03-26: qty 2

## 2016-03-26 MED ORDER — KETOROLAC TROMETHAMINE 30 MG/ML IJ SOLN
30.0000 mg | Freq: Once | INTRAMUSCULAR | Status: AC
  Administered 2016-03-26: 30 mg via INTRAVENOUS
  Filled 2016-03-26: qty 1

## 2016-03-26 MED ORDER — IOPAMIDOL (ISOVUE-300) INJECTION 61%
100.0000 mL | Freq: Once | INTRAVENOUS | Status: AC | PRN
  Administered 2016-03-26: 100 mL via INTRAVENOUS

## 2016-03-26 MED ORDER — ONDANSETRON HCL 4 MG/2ML IJ SOLN
4.0000 mg | Freq: Once | INTRAMUSCULAR | Status: AC
  Administered 2016-03-26: 4 mg via INTRAVENOUS
  Filled 2016-03-26: qty 2

## 2016-03-26 NOTE — Consult Note (Signed)
I have been asked to see the patient by Dr. Larae Grooms, for evaluation and management of right renal mass.  History of present illness: 41 year old male who presented to the ER with right-sided flank pain and gross hematuria. He had had some hematuria earlier but the pain became intense. In the emergency room he was found to have gross hematuria with no clear evidence of infection. His white blood cell count was normal, his hemoglobin was 14.6 and he had a slight elevation in his creatinine.  The patient was noted to have a 12 cm right lower pole renal mass with extension into the renal pelvis. A repeat IV contrasted and CT scan demonstrated renal vein invasion with no IVC invasion. There were some small pulmonary nodules, otherwise nonspecific. There is no evidence of metastatic disease in other areas. The patient was in 10/10 pain, with IV pain medication his pain went back to 1.  The patient has no significant past medical history except for acid reflux and anxiety/depression.  The patient has a history of laparoscopic cholecystectomy.  The patient has a family history significant for rhabdomyosarcoma and his grandmother and history of endometrial cancer. There is no GU malignancies noted.  Review of systems: A 12 point comprehensive review of systems was obtained and is negative unless otherwise stated in the history of present illness.  There are no active problems to display for this patient.   No current facility-administered medications on file prior to encounter.    Current Outpatient Prescriptions on File Prior to Encounter  Medication Sig Dispense Refill  . lamoTRIgine (LAMICTAL) 100 MG tablet Take 1 tablet (100 mg total) by mouth daily. 30 tablet 1  . omeprazole (PRILOSEC) 20 MG capsule Take 20 mg by mouth daily.      Past Medical History:  Diagnosis Date  . Anxiety   . Depression   . Hypertension     Past Surgical History:  Procedure Laterality Date  .  CHOLECYSTECTOMY      Social History  Substance Use Topics  . Smoking status: Current Some Day Smoker    Packs/day: 0.25    Types: Cigarettes    Start date: 10/23/1989  . Smokeless tobacco: Current User    Types: Snuff     Comment: States only smokes when he drinks occasionally   . Alcohol use 0.0 - 4.2 oz/week     Comment: Occasional use    Family History  Problem Relation Age of Onset  . Depression Sister     PE: Vitals:   03/26/16 0854 03/26/16 1000  BP: (!) 156/91 137/89  Pulse: 92 73  Resp: 16   Temp: 97.6 F (36.4 C)   TempSrc: Oral   SpO2: 98% 92%  Weight: 108.9 kg (240 lb)   Height: 6\' 2"  (1.88 m)    Patient appears to be in no acute distress  patient is alert and oriented x3 Atraumatic normocephalic head No cervical or supraclavicular lymphadenopathy appreciated No increased work of breathing, no audible wheezes/rhonchi Regular sinus rhythm/rate Abdomen is soft, nontender, nondistended, no CVA or suprapubic tenderness There is a palpable mass in the right upper quadrant likely related to the mass emanating from the right lower pole. The patient has laparoscopic incisions from his recent cholecystectomy. Lower extremities are symmetric without appreciable edema Grossly neurologically intact No identifiable skin lesions   Recent Labs  03/26/16 0906  WBC 8.3  HGB 14.6  HCT 42.5    Recent Labs  03/26/16 0906  NA 137  K  4.1  CL 102  CO2 26  GLUCOSE 103*  BUN 14  CREATININE 1.33*  CALCIUM 9.1   No results for input(s): LABPT, INR in the last 72 hours. No results for input(s): LABURIN in the last 72 hours. No results found for this or any previous visit.  Imaging: I have independently reviewed the images from hsi CT scans with the findings as described in the HPI.  The official read is pending.  Imp: Large right lower pole renal mass with invasion into the renal vein and renal pelvis.  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  Pulmonary nodules are  non-specific and will be followed closely.  Recommendations: I went over the findings with the patient and his family.  I discussed the likelihood that this is renal cell cancer and recommended surgical extirpation.  I went over the likely approach of a laparoscopic radical nephrectomy.  I discussed the surgery in detail as well as the risk/benefits.  We discussed the expected hospital recovery as well as the time of recovery prior to returning to work.  I will work to get this scheduled in the next 7-14 days.   Louis Meckel W

## 2016-03-26 NOTE — ED Notes (Signed)
Catheter changed to leg bag for discharge.

## 2016-03-26 NOTE — ED Provider Notes (Signed)
[facility] Emergency Department Provider Note  ____________________________________________   First MD Initiated Contact with Patient 03/26/16 [phone]     (approximate)  I have reviewed the triage vital signs and the nursing notes.   HISTORY  Chief Complaint Flank Pain and Hematuria   HPI Shawn Estrada is a 41 y.o. male without any chronic medical problems was presenting to the emergency department today with sudden onset right lower quadrant and right flank pain that started this morning after having 2 episodes of hematuria. He says that he thinks he has passed a kidney stone in the past but has never been formally diagnosed. He describes the pain as stabbing and an 8 out of 10 at this time. Describes nausea but no vomiting.   Past Medical History:  Diagnosis Date  . Anxiety   . Depression   . Hypertension     There are no active problems to display for this patient.   Past Surgical History:  Procedure Laterality Date  . CHOLECYSTECTOMY      Prior to Admission medications   Medication Sig Start Date End Date Taking? Authorizing Provider  ibuprofen (ADVIL,MOTRIN) 200 MG tablet Take 200 mg by mouth every 6 (six) hours as needed.   Yes Historical Provider, MD  lamoTRIgine (LAMICTAL) 100 MG tablet Take 1 tablet (100 mg total) by mouth daily. 02/12/16  Yes Kathlee Nations, MD  omeprazole (PRILOSEC) 20 MG capsule Take 20 mg by mouth daily.   Yes Historical Provider, MD    Allergies Patient has no known allergies.  Family History  Problem Relation Age of Onset  . Depression Sister     Social History Social History  Substance Use Topics  . Smoking status: Current Some Day Smoker    Packs/day: 0.25    Types: Cigarettes    Start date: 10/23/1989  . Smokeless tobacco: Current User    Types: Snuff     Comment: States only smokes when he drinks occasionally   . Alcohol use 0.0 - 4.2 oz/week     Comment: Occasional use    Review of  Systems Constitutional: No fever/chills Eyes: No visual changes. ENT: No sore throat. Cardiovascular: Denies chest pain. Respiratory: Denies shortness of breath. Gastrointestinal:  no vomiting.  No diarrhea.  No constipation. Genitourinary: Negative for dysuria. Musculoskeletal: right flank pain Skin: Negative for rash. Neurological: Negative for headaches, focal weakness or numbness.  10-point ROS otherwise negative.  ____________________________________________   PHYSICAL EXAM:  VITAL SIGNS: ED Triage Vitals [03/26/16 0854]  Enc Vitals Group     BP (!) 156/91     Pulse Rate 92     Resp 16     Temp 97.6 F (36.4 C)     Temp Source Oral     SpO2 98 %     Weight 240 lb (108.9 kg)     Height 6\' 2"  (1.88 m)     Head Circumference      Peak Flow      Pain Score 9     Pain Loc      Pain Edu?      Excl. in [place]?     Constitutional: Alert and oriented. Appears uncomfortable Eyes: Conjunctivae are normal. PERRL. EOMI. Head: Atraumatic. Nose: No congestion/rhinnorhea. Mouth/Throat: Mucous membranes are moist.   Neck: No stridor.   Cardiovascular: Normal rate, regular rhythm. Grossly normal heart sounds.   Respiratory: Normal respiratory effort.  No retractions. Lungs CTAB. Gastrointestinal: Soft and nontender. No distention.  No  CVA tenderness. Musculoskeletal: No lower extremity tenderness nor edema.  No joint effusions. Neurologic:  Normal speech and language. No gross focal neurologic deficits are appreciated. Skin:  Skin is warm, dry and intact. No rash noted. Psychiatric: Mood and affect are normal. Speech and behavior are normal.  ____________________________________________   LABS (all labs ordered are listed, but only abnormal results are displayed)  Labs Reviewed  URINALYSIS, COMPLETE (UACMP) WITH MICROSCOPIC - Abnormal; Notable for the following:       Result Value   Color, Urine RED (*)    APPearance CLOUDY (*)    Glucose, UA   (*)    Value: TEST NOT  REPORTED DUE TO COLOR INTERFERENCE OF URINE PIGMENT   Hgb urine dipstick   (*)    Value: TEST NOT REPORTED DUE TO COLOR INTERFERENCE OF URINE PIGMENT   Bilirubin Urine   (*)    Value: TEST NOT REPORTED DUE TO COLOR INTERFERENCE OF URINE PIGMENT   Ketones, ur   (*)    Value: TEST NOT REPORTED DUE TO COLOR INTERFERENCE OF URINE PIGMENT   Protein, ur   (*)    Value: TEST NOT REPORTED DUE TO COLOR INTERFERENCE OF URINE PIGMENT   Nitrite   (*)    Value: TEST NOT REPORTED DUE TO COLOR INTERFERENCE OF URINE PIGMENT   Leukocytes, UA   (*)    Value: TEST NOT REPORTED DUE TO COLOR INTERFERENCE OF URINE PIGMENT   All other components within normal limits  BASIC METABOLIC PANEL - Abnormal; Notable for the following:    Glucose, Bld 103 (*)    Creatinine, Ser 1.33 (*)    All other components within normal limits  CBC WITH DIFFERENTIAL/PLATELET   ____________________________________________  EKG   ____________________________________________  RADIOLOGY    CT CHEST W CONTRAST (Final result)  Result time 03/26/16 13:38:20  Final result by Sherryl Barters, MD (03/26/16 13:38:20)           Narrative:   CLINICAL DATA: Right renal mass. Right-sided flank pain and hematuria.  EXAM: CT CHEST, ABDOMEN, AND PELVIS WITH CONTRAST  TECHNIQUE: Multidetector CT imaging of the chest, abdomen and pelvis was performed following the standard protocol during bolus administration of intravenous contrast.  CONTRAST: 160mL ISOVUE-300 IOPAMIDOL (ISOVUE-300) INJECTION 61%  COMPARISON: 03/26/2016  FINDINGS: CT CHEST FINDINGS  Cardiovascular: Unremarkable  Mediastinum/Nodes: Right paratracheal node 0.7 cm in short axis on image 29/7. Other small mediastinal lymph nodes are also observed.  Lungs/Pleura: There are approximately 17 scattered pulmonary nodules. Most of these are small. An index nodule in the left upper lobe near the major fissure measures 1.1 by 0.5 cm on image 30/8.  No cavitation.  Musculoskeletal: Unremarkable  CT ABDOMEN PELVIS FINDINGS  Hepatobiliary: Cholecystectomy. No hepatic mass identified.  Pancreas: Unremarkable  Spleen: Unremarkable  Adrenals/Urinary Tract: Adrenal glands normal.  11.4 by 12.2 by 8.7 cm centrally necrotic exophytic mass from the right kidney lower pole with lobulations of tumor extending up into the left mid kidney, the renal hilum, and in the perirenal space. Tumor vascularity in the perirenal space. The lower pole mass extends to the fascia margin of the right psoas muscle, and micro invasion of the psoas is not readily excluded.  Indistinct right proximal ureter, with mild right hydronephrosis and a lead excretion of the right kidney possibly from extrinsic compression or invasion of the right proximal ureter.  No left renal mass.  In the urinary bladder there are filling defects measuring approximately 3.1 by 1.9 cm for  example on image 204/7. Comparing the postcontrast density on today' s exam with the precontrast density from 03/26/2016, I favor this as representing blood clots rather than enhancing tumor.  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 The medial component of tumor from the right kidney on image 50/2 appears to somewhat displace the IVC anteriorly and to the left.  AP window lymph node 0.9 cm in short axis image 51/2. Accessory right renal vein draining the neovascularity from the right inferior perirenal space.  A right external iliac node measures 0.5 cm in short axis on image 188/7.  Reproductive: Unremarkable  Other: Perirenal stranding on the right.  Musculoskeletal: Schmorl's node along the superior endplate of L5. Bridging spurring of the right sacroiliac joint.  IMPRESSION: 1. 12.2 cm centrally necrotic exophytic mass of the right kidney lower pole with considerable lobulations of tumor extending into  the right renal hilum and perirenal space. Associated tumor thrombus in the right renal vein. Upper normal sized AP window lymph node at this level. Delayed excretion from the right kidney with mild upper pole hydronephrosis possibly due to extrinsic compression or invasion of the right proximal ureter. 2. Scattered small pulmonary nodules in the lungs, high suspicion for metastatic disease in this context. 3. Filling defect in the deep ended part of the urinary bladder does not appear to be enhancing and probably represents blood clots.   Electronically Signed By: Van Clines M.D. On: 03/26/2016 13:38            CT ABDOMEN PELVIS W WO CONTRAST (Final result)  Result time 03/26/16 13:38:20  Final result by Sherryl Barters, MD (03/26/16 13:38:20)           Narrative:   CLINICAL DATA: Right renal mass. Right-sided flank pain and hematuria.  EXAM: CT CHEST, ABDOMEN, AND PELVIS WITH CONTRAST  TECHNIQUE: Multidetector CT imaging of the chest, abdomen and pelvis was performed following the standard protocol during bolus administration of intravenous contrast.  CONTRAST: 122mL ISOVUE-300 IOPAMIDOL (ISOVUE-300) INJECTION 61%  COMPARISON: 03/26/2016  FINDINGS: CT CHEST FINDINGS  Cardiovascular: Unremarkable  Mediastinum/Nodes: Right paratracheal node 0.7 cm in short axis on image 29/7. Other small mediastinal lymph nodes are also observed.  Lungs/Pleura: There are approximately 17 scattered pulmonary nodules. Most of these are small. An index nodule in the left upper lobe near the major fissure measures 1.1 by 0.5 cm on image 30/8. No cavitation.  Musculoskeletal: Unremarkable  CT ABDOMEN PELVIS FINDINGS  Hepatobiliary: Cholecystectomy. No hepatic mass identified.  Pancreas: Unremarkable  Spleen: Unremarkable  Adrenals/Urinary Tract: Adrenal glands normal.  11.4 by 12.2 by 8.7 cm centrally necrotic exophytic mass from the right kidney lower  pole with lobulations of tumor extending up into the left mid kidney, the renal hilum, and in the perirenal space. Tumor vascularity in the perirenal space. The lower pole mass extends to the fascia margin of the right psoas muscle, and micro invasion of the psoas is not readily excluded.  Indistinct right proximal ureter, with mild right hydronephrosis and a lead excretion of the right kidney possibly from extrinsic compression or invasion of the right proximal ureter.  No left renal mass.  In the urinary bladder there are filling defects measuring approximately 3.1 by 1.9 cm for example on image 204/7. Comparing the postcontrast density on today' s exam with the precontrast density from 03/26/2016, I favor this as representing blood clots rather than enhancing tumor.  Stomach/Bowel: Unremarkable  Vascular/Lymphatic: Tumor thrombus in  the right renal vein as on image 33/2. The medial component of tumor from the right kidney on image 50/2 appears to somewhat displace the IVC anteriorly and to the left.  AP window lymph node 0.9 cm in short axis image 51/2. Accessory right renal vein draining the neovascularity from the right inferior perirenal space.  A right external iliac node measures 0.5 cm in short axis on image 188/7.  Reproductive: Unremarkable  Other: Perirenal stranding on the right.  Musculoskeletal: Schmorl's node along the superior endplate of L5. Bridging spurring of the right sacroiliac joint.  IMPRESSION: 1. 12.2 cm centrally necrotic exophytic mass of the right kidney lower pole with considerable lobulations of tumor extending into the right renal hilum and perirenal space. Associated tumor thrombus in the right renal vein. Upper normal sized AP window lymph node at this level. Delayed excretion from the right kidney with mild upper pole hydronephrosis possibly due to extrinsic compression or invasion of the right proximal ureter. 2. Scattered small  pulmonary nodules in the lungs, high suspicion for metastatic disease in this context. 3. Filling defect in the deep ended part of the urinary bladder does not appear to be enhancing and probably represents blood clots.   Electronically Signed By: Van Clines M.D. On: 03/26/2016 13:38            CT RENAL STONE STUDY (Final result)  Result time 03/26/16 10:01:14  Final result by York Ram, MD (03/26/16 10:01:14)           Narrative:   CLINICAL DATA: Right-sided flank pain.  EXAM: CT ABDOMEN AND PELVIS WITHOUT CONTRAST  TECHNIQUE: Multidetector CT imaging of the abdomen and pelvis was performed following the standard protocol without IV contrast.  COMPARISON: November 30, 2009  FINDINGS: Lower chest: Multiple nodules are seen in the bilateral lung bases, new in the interval. A representative nodule in the left base on series 4, image well measures 6.6 mm. Atelectasis in the lingula. Lung bases are otherwise unremarkable.  Hepatobiliary: The patient is status post cholecystectomy. The liver may identified.  Pancreas: Unremarkable. No pancreatic ductal dilatation or surrounding inflammatory changes.  Spleen: Normal in size without focal abnormality.  Adrenals/Urinary Tract: The left kidney is normal in appearance with no stones, hydronephrosis, perinephric stranding, or ureterectasis. There is a large suspicious mass associated with the lower pole of the right kidney best appreciated on coronal image 79 measuring 12 x 7.5 cm. I suspect extension into the right renal pelvis as seen on coronal image 79. The apparent extension into the right renal hilum abuts and deforms the right renal pelvis as seen on axial image 37. Extension into the right renal pelvis is not excluded. There is mild right-sided hydronephrosis. There is increased vascularity around the right kidney and adjacent fat stranding. The right ureter is nondilated. There is  nodular high attenuation posteriorly in the bladder on series 2, image 87 measuring up to 2.9 cm. The bladder is otherwise unremarkable  Stomach/Bowel: There is a small hiatal hernia. The stomach is otherwise normal. The stomach and small bowel are normal. Scattered colonic diverticulosis is seen without diverticulitis. The colon is otherwise normal. The appendix is not seen but there is no secondary evidence of appendicitis.  Vascular/Lymphatic: No definitive adenopathy identified. The right renal vein is distended compared to the left. No aortic aneurysm or significant atherosclerotic.  Reproductive: Prostate is unremarkable.  Other: No abdominal wall hernia or abnormality. No abdominopelvic ascites.  Musculoskeletal: No acute or significant osseous findings.  IMPRESSION: 1. The right kidney is abnormal, highly suspicious for a large renal cell carcinoma. The suspected tumor extends into the right renal hilum exerting mass effect on the right renal pelvis. Invasion of the right renal pelvis is not excluded. The right renal vein is larger than the left and tumor extension into the right renal vein is not excluded. There is resulting right-sided mild hydronephrosis. There is increased vascularity around the right kidney. An MRI could better assess the suspected right-sided malignant/neoplastic process. 2. Nodular high attenuation in the bladder may represent blood products. Given the attenuation, I suspect tumor in the bladder is less likely. However, this region should be assessed further with direct visualization. 3. Multiple small nodules in the lung bases are suspicious for early metastatic disease.   Electronically Signed By: Dorise Bullion III M.D On: 03/26/2016 10:01            ____________________________________________   PROCEDURES  Procedure(s) performed:   Procedures  Critical Care performed:    ____________________________________________   INITIAL IMPRESSION / ASSESSMENT AND PLAN / ED COURSE  Pertinent labs & imaging results that were available during my care of the patient were reviewed by me and considered in my medical decision making (see chart for details).  ----------------------------------------- 11:39 AM on 03/26/2016 -----------------------------------------  Patient seen and evaluated by Dr. Louis Meckel of urology who recommends a contrast scan. Patient's pain is now controlled after morphine. 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    ----------------------------------------- 1:45 PM on 03/26/2016 -----------------------------------------  Patient to follow-up with Dr. Louis Meckel in the office for his kidney mass. Given copy of the abdomen and pelvis with contrast reported as requested. Patient now saying that he is having difficulty urinating and urinary clots in the emergency department. We will place a Foley. Patient does understand the diagnosis and the plan for follow-up with urology.  ____________________________________________   FINAL CLINICAL IMPRESSION(S) / ED DIAGNOSES  Final diagnoses:  Right flank pain  Lung mass  Renal mass      NEW MEDICATIONS STARTED DURING THIS VISIT:  New Prescriptions   No medications on file     Note:  This document was prepared using Dragon voice recognition software and may include unintentional dictation errors.    Orbie Pyo, MD 03/26/16 1346

## 2016-03-26 NOTE — ED Triage Notes (Signed)
Pt reports that he woke up this am with blood in his urine and rt flank pain.

## 2016-03-26 NOTE — ED Notes (Signed)
Patient transported to CT 

## 2016-03-28 ENCOUNTER — Other Ambulatory Visit: Payer: Self-pay | Admitting: Urology

## 2016-03-28 DIAGNOSIS — D49511 Neoplasm of unspecified behavior of right kidney: Secondary | ICD-10-CM | POA: Diagnosis not present

## 2016-03-28 NOTE — Patient Instructions (Addendum)
Shawn Estrada  03/28/2016   Your procedure is scheduled on: 03/31/16  Report to [facility] Main  Entrance take [facility]  elevators to 3rd floor to  [place] at    [phone].  Call this number if you have problems the morning of surgery [phone]   Remember: ONLY 1 PERSON MAY GO WITH YOU TO SHORT STAY TO GET  READY MORNING OF YOUR SURGERY.  Do not eat food or drink liquids :After Midnight.     Take these medicines the morning of surgery with A SIP OF WATER: cipro, Lamictal, prilosec                                You may not have any metal on your body including hair pins and              piercings  Do not wear jewelry,lotions, powders or perfumes, deodorant                         Men may shave face and neck.   Do not bring valuables to the hospital. [place].  Contacts, dentures or bridgework may not be worn into surgery.  Leave suitcase in the car. After surgery it may be brought to your room.                Please read over the following fact sheets you were given: _____________________________________________________________________             [facility] - Preparing for Surgery Before surgery, you can play an important role.  Because skin is not sterile, your skin needs to be as free of germs as possible.  You can reduce the number of germs on your skin by washing with CHG (chlorahexidine gluconate) soap before surgery.  CHG is an antiseptic cleaner which kills germs and bonds with the skin to continue killing germs even after washing. Please DO NOT use if you have an allergy to CHG or antibacterial soaps.  If your skin becomes reddened/irritated stop using the CHG and inform your nurse when you arrive at Short Stay. Do not shave (including legs and underarms) for at least 48 hours prior to the first CHG shower.  You may shave your face/neck. Please follow these instructions carefully:  1.   Shower with CHG Soap the night before surgery and the  morning of Surgery.  2.  If you choose to wash your hair, wash your hair first as usual with your  normal  shampoo.  3.  After you shampoo, rinse your hair and body thoroughly to remove the  shampoo.                           4.  Use CHG as you would any other liquid soap.  You can apply chg directly  to the skin and wash                       Gently with a scrungie or clean washcloth.  5.  Apply the CHG Soap to your body ONLY FROM THE NECK DOWN.   Do not use on face/ open  Wound or open sores. Avoid contact with eyes, ears mouth and genitals (private parts).                       Wash face,  Genitals (private parts) with your normal soap.             6.  Wash thoroughly, paying special attention to the area where your surgery  will be performed.  7.  Thoroughly rinse your body with warm water from the neck down.  8.  DO NOT shower/wash with your normal soap after using and rinsing off  the CHG Soap.                9.  Pat yourself dry with a clean towel.            10.  Wear clean pajamas.            11.  Place clean sheets on your bed the night of your first shower and do not  sleep with pets. Day of Surgery : Do not apply any lotions/deodorants the morning of surgery.  Please wear clean clothes to the hospital/surgery center.  FAILURE TO FOLLOW THESE INSTRUCTIONS MAY RESULT IN THE CANCELLATION OF YOUR SURGERY PATIENT SIGNATURE_________________________________  NURSE SIGNATURE__________________________________  ________________________________________________________________________  WHAT IS A BLOOD TRANSFUSION? Blood Transfusion Information  A transfusion is the replacement of blood or some of its parts. Blood is made up of multiple cells which provide different functions.  Red blood cells carry oxygen and are used for blood loss replacement.  White blood cells fight against infection.  Platelets control  bleeding.  Plasma helps clot blood.  Other blood products are available for specialized needs, such as hemophilia or other clotting disorders. BEFORE THE TRANSFUSION  Who gives blood for transfusions?   Healthy volunteers who are fully evaluated to make sure their blood is safe. This is blood bank blood. Transfusion therapy is the safest it has ever been in the practice of medicine. Before blood is taken from a donor, a complete history is taken to make sure that person has no history of diseases nor engages in risky social behavior (examples are intravenous drug use or sexual activity with multiple partners). The donor's travel history is screened to minimize risk of transmitting infections, such as malaria. The donated blood is tested for signs of infectious diseases, such as HIV and hepatitis. The blood is then tested to be sure it is compatible with you in order to minimize the chance of a transfusion reaction. If you or a relative donates blood, this is often done in anticipation of surgery and is not appropriate for emergency situations. It takes many days to process the donated blood. RISKS AND COMPLICATIONS Although transfusion therapy is very safe and saves many lives, the main dangers of transfusion include:   Getting an infectious disease.  Developing a transfusion reaction. This is an allergic reaction to something in the blood you were given. Every precaution is taken to prevent this. The decision to have a blood transfusion has been considered carefully by your caregiver before blood is given. Blood is not given unless the benefits outweigh the risks. AFTER THE TRANSFUSION  Right after receiving a blood transfusion, you will usually feel much better and more energetic. This is especially true if your red blood cells have gotten low (anemic). The transfusion raises the level of the red blood cells which carry oxygen, and this usually causes an energy increase.  The  nurse  administering the transfusion will monitor you carefully for complications. HOME CARE INSTRUCTIONS  No special instructions are needed after a transfusion. You may find your energy is better. Speak with your caregiver about any limitations on activity for underlying diseases you may have. SEEK MEDICAL CARE IF:   Your condition is not improving after your transfusion.  You develop redness or irritation at the intravenous (IV) site. SEEK IMMEDIATE MEDICAL CARE IF:  Any of the following symptoms occur over the next 12 hours:  Shaking chills.  You have a temperature by mouth above 102 F (38.9 C), not controlled by medicine.  Chest, back, or muscle pain.  People around you feel you are not acting correctly or are confused.  Shortness of breath or difficulty breathing.  Dizziness and fainting.  You get a rash or develop hives.  You have a decrease in urine output.  Your urine turns a dark color or changes to pink, red, or brown. Any of the following symptoms occur over the next 10 days:  You have a temperature by mouth above 102 F (38.9 C), not controlled by medicine.  Shortness of breath.  Weakness after normal activity.  The white part of the eye turns yellow (jaundice).  You have a decrease in the amount of urine or are urinating less often.  Your urine turns a dark color or changes to pink, red, or brown. Document Released: 12/25/1999 Document Revised: 03/21/2011 Document Reviewed: 08/13/2007 [facility] Patient Information 2014 [employer], [place].  _______________________________________________________________________   _____________________________________________________________________

## 2016-03-29 ENCOUNTER — Encounter (HOSPITAL_COMMUNITY)
Admission: RE | Admit: 2016-03-29 | Discharge: 2016-03-29 | Disposition: A | Discharge status: Home / Self Care | Payer: Commercial Managed Care - HMO | Source: Ambulatory Visit | Attending: Urology | Admitting: Urology

## 2016-03-29 ENCOUNTER — Encounter (HOSPITAL_COMMUNITY): Payer: Self-pay

## 2016-03-29 HISTORY — DX: Other specified postprocedural states: R11.2

## 2016-03-29 HISTORY — DX: Malignant (primary) neoplasm, unspecified: C80.1

## 2016-03-29 HISTORY — DX: Adverse effect of unspecified anesthetic, initial encounter: T41.45XA

## 2016-03-29 HISTORY — DX: Sleep apnea, unspecified: G47.30

## 2016-03-29 HISTORY — DX: Nausea with vomiting, unspecified: Z98.890

## 2016-03-29 HISTORY — DX: Other complications of anesthesia, initial encounter: T88.59XA

## 2016-03-29 HISTORY — DX: Gastro-esophageal reflux disease without esophagitis: K21.9

## 2016-03-29 LAB — COMPREHENSIVE METABOLIC PANEL
ALBUMIN: 4.3 g/dL (ref 3.5–5.0)
ALT: 23 U/L (ref 17–63)
AST: 28 U/L (ref 15–41)
Alkaline Phosphatase: 72 U/L (ref 38–126)
Anion gap: 10 (ref 5–15)
BILIRUBIN TOTAL: 1.3 mg/dL — AB (ref 0.3–1.2)
BUN: 14 mg/dL (ref 6–20)
CO2: 23 mmol/L (ref 22–32)
CREATININE: 1.57 mg/dL — AB (ref 0.61–1.24)
Calcium: 9.3 mg/dL (ref 8.9–10.3)
Chloride: 102 mmol/L (ref 101–111)
GFR calc Af Amer: >60 mL/min (ref >60)
GFR calc non Af Amer: 53 mL/min — ABNORMAL LOW (ref >60)
Glucose, Bld: 87 mg/dL (ref 65–99)
POTASSIUM: 4.1 mmol/L (ref 3.5–5.1)
Sodium: 135 mmol/L (ref 135–145)
TOTAL PROTEIN: 8.2 g/dL — AB (ref 6.5–8.1)

## 2016-03-29 LAB — CBC
HEMATOCRIT: 40.9 % (ref 39.0–52.0)
Hemoglobin: 13.9 g/dL (ref 13.0–17.0)
MCH: 29.1 pg (ref 26.0–34.0)
MCHC: 34 g/dL (ref 30.0–36.0)
MCV: 85.6 fL (ref 78.0–100.0)
PLATELETS: 301 10*3/uL (ref 150–400)
RBC: 4.78 MIL/uL (ref 4.22–5.81)
RDW: 12.4 % (ref 11.5–15.5)
WBC: 9.8 10*3/uL (ref 4.0–10.5)

## 2016-03-29 LAB — ABO/RH: ABO/RH(D): A POS

## 2016-03-29 NOTE — Progress Notes (Signed)
EKG 08/31/15 epic CT chest 03/26/16 epic LOV [place] 02/26/16 epic

## 2016-03-29 NOTE — Progress Notes (Signed)
CMP done 03/29/16 routed to Dr. Louis Meckel via epic

## 2016-03-31 ENCOUNTER — Encounter (HOSPITAL_COMMUNITY): Payer: Self-pay

## 2016-03-31 ENCOUNTER — Encounter (HOSPITAL_COMMUNITY)
Admission: RE | Disposition: A | Discharge status: Home / Self Care | Payer: Self-pay | Source: Ambulatory Visit | Attending: Urology

## 2016-03-31 ENCOUNTER — Inpatient Hospital Stay (HOSPITAL_COMMUNITY): Payer: Commercial Managed Care - HMO | Admitting: Anesthesiology

## 2016-03-31 ENCOUNTER — Inpatient Hospital Stay (HOSPITAL_COMMUNITY)
Admission: RE | Admit: 2016-03-31 | Discharge: 2016-04-04 | DRG: 657 | Disposition: A | Discharge status: Home / Self Care | Payer: Commercial Managed Care - HMO | Source: Ambulatory Visit | Attending: Urology | Admitting: Urology

## 2016-03-31 DIAGNOSIS — D49511 Neoplasm of unspecified behavior of right kidney: Secondary | ICD-10-CM | POA: Diagnosis not present

## 2016-03-31 DIAGNOSIS — F329 Major depressive disorder, single episode, unspecified: Secondary | ICD-10-CM | POA: Diagnosis present

## 2016-03-31 DIAGNOSIS — N2889 Other specified disorders of kidney and ureter: Secondary | ICD-10-CM | POA: Diagnosis not present

## 2016-03-31 DIAGNOSIS — D62 Acute posthemorrhagic anemia: Secondary | ICD-10-CM | POA: Diagnosis not present

## 2016-03-31 DIAGNOSIS — K219 Gastro-esophageal reflux disease without esophagitis: Secondary | ICD-10-CM | POA: Diagnosis present

## 2016-03-31 DIAGNOSIS — I10 Essential (primary) hypertension: Secondary | ICD-10-CM | POA: Diagnosis present

## 2016-03-31 DIAGNOSIS — F419 Anxiety disorder, unspecified: Secondary | ICD-10-CM | POA: Diagnosis present

## 2016-03-31 DIAGNOSIS — Z79899 Other long term (current) drug therapy: Secondary | ICD-10-CM

## 2016-03-31 DIAGNOSIS — F17210 Nicotine dependence, cigarettes, uncomplicated: Secondary | ICD-10-CM | POA: Diagnosis present

## 2016-03-31 DIAGNOSIS — C641 Malignant neoplasm of right kidney, except renal pelvis: Secondary | ICD-10-CM | POA: Diagnosis not present

## 2016-03-31 HISTORY — PX: LAPAROSCOPIC NEPHRECTOMY: SHX1930

## 2016-03-31 LAB — COMPREHENSIVE METABOLIC PANEL
ALK PHOS: 56 U/L (ref 38–126)
ALT: 38 U/L (ref 17–63)
ANION GAP: 10 (ref 5–15)
AST: 39 U/L (ref 15–41)
Albumin: 3.5 g/dL (ref 3.5–5.0)
BILIRUBIN TOTAL: 1.1 mg/dL (ref 0.3–1.2)
BUN: 20 mg/dL (ref 6–20)
CHLORIDE: 102 mmol/L (ref 101–111)
CO2: 23 mmol/L (ref 22–32)
Calcium: 8 mg/dL — ABNORMAL LOW (ref 8.9–10.3)
Creatinine, Ser: 1.77 mg/dL — ABNORMAL HIGH (ref 0.61–1.24)
GFR calc Af Amer: 53 mL/min — ABNORMAL LOW (ref >60)
GFR calc non Af Amer: 46 mL/min — ABNORMAL LOW (ref >60)
GLUCOSE: 188 mg/dL — AB (ref 65–99)
POTASSIUM: 4.2 mmol/L (ref 3.5–5.1)
Sodium: 135 mmol/L (ref 135–145)
TOTAL PROTEIN: 6.4 g/dL — AB (ref 6.5–8.1)

## 2016-03-31 LAB — CBC
HEMATOCRIT: 28.1 % — AB (ref 39.0–52.0)
HEMOGLOBIN: 9.7 g/dL — AB (ref 13.0–17.0)
MCH: 29.8 pg (ref 26.0–34.0)
MCHC: 34.5 g/dL (ref 30.0–36.0)
MCV: 86.2 fL (ref 78.0–100.0)
Platelets: 232 10*3/uL (ref 150–400)
RBC: 3.26 MIL/uL — ABNORMAL LOW (ref 4.22–5.81)
RDW: 12.5 % (ref 11.5–15.5)
WBC: 11.1 10*3/uL — ABNORMAL HIGH (ref 4.0–10.5)

## 2016-03-31 LAB — POCT I-STAT 4, (NA,K, GLUC, HGB,HCT)
Glucose, Bld: 169 mg/dL — ABNORMAL HIGH (ref 65–99)
HCT: 27 % — ABNORMAL LOW (ref 39.0–52.0)
HEMOGLOBIN: 9.2 g/dL — AB (ref 13.0–17.0)
POTASSIUM: 4.4 mmol/L (ref 3.5–5.1)
Sodium: 134 mmol/L — ABNORMAL LOW (ref 135–145)

## 2016-03-31 LAB — PREPARE RBC (CROSSMATCH)

## 2016-03-31 SURGERY — NEPHRECTOMY, RADICAL, LAPAROSCOPIC, ADULT
Anesthesia: General | Laterality: Right

## 2016-03-31 MED ORDER — LACTATED RINGERS IV SOLN
INTRAVENOUS | Status: DC | PRN
  Administered 2016-03-31 (×4): via INTRAVENOUS

## 2016-03-31 MED ORDER — OXYCODONE HCL 5 MG PO TABS
5.0000 mg | ORAL_TABLET | ORAL | Status: DC | PRN
  Administered 2016-04-01 (×3): 5 mg via ORAL
  Administered 2016-04-01: 10 mg via ORAL
  Administered 2016-04-01: 5 mg via ORAL
  Administered 2016-04-02 (×4): 10 mg via ORAL
  Filled 2016-03-31: qty 1
  Filled 2016-03-31: qty 2
  Filled 2016-03-31 (×2): qty 1
  Filled 2016-03-31: qty 2
  Filled 2016-03-31: qty 1
  Filled 2016-03-31 (×3): qty 2

## 2016-03-31 MED ORDER — PANTOPRAZOLE SODIUM 40 MG PO TBEC
40.0000 mg | DELAYED_RELEASE_TABLET | Freq: Every day | ORAL | Status: DC
Start: 2016-04-01 — End: 2016-04-04
  Administered 2016-04-01 – 2016-04-04 (×4): 40 mg via ORAL
  Filled 2016-03-31 (×4): qty 1

## 2016-03-31 MED ORDER — SUGAMMADEX SODIUM 200 MG/2ML IV SOLN
INTRAVENOUS | Status: DC | PRN
  Administered 2016-03-31: 400 mg via INTRAVENOUS
  Administered 2016-03-31: 100 mg via INTRAVENOUS

## 2016-03-31 MED ORDER — HYDROMORPHONE HCL 2 MG/ML IJ SOLN
INTRAMUSCULAR | Status: AC
  Filled 2016-03-31: qty 1

## 2016-03-31 MED ORDER — DEXAMETHASONE SODIUM PHOSPHATE 10 MG/ML IJ SOLN
INTRAMUSCULAR | Status: AC
  Filled 2016-03-31: qty 1

## 2016-03-31 MED ORDER — HYDROMORPHONE HCL 1 MG/ML IJ SOLN
INTRAMUSCULAR | Status: DC | PRN
  Administered 2016-03-31: .2 mg via INTRAVENOUS
  Administered 2016-03-31 (×3): .4 mg via INTRAVENOUS

## 2016-03-31 MED ORDER — DIPHENHYDRAMINE HCL 12.5 MG/5ML PO ELIX: 12.5000 mg | ORAL_SOLUTION | Freq: Four times a day (QID) | ORAL | Status: DC | PRN

## 2016-03-31 MED ORDER — ALBUMIN HUMAN 5 % IV SOLN
INTRAVENOUS | Status: AC
  Filled 2016-03-31: qty 250

## 2016-03-31 MED ORDER — CIPROFLOXACIN IN D5W 400 MG/200ML IV SOLN
INTRAVENOUS | Status: AC
  Filled 2016-03-31: qty 200

## 2016-03-31 MED ORDER — LAMOTRIGINE 100 MG PO TABS
100.0000 mg | ORAL_TABLET | Freq: Every day | ORAL | Status: DC
  Administered 2016-04-01 – 2016-04-04 (×4): 100 mg via ORAL
  Filled 2016-03-31 (×4): qty 1

## 2016-03-31 MED ORDER — PHENYLEPHRINE HCL 10 MG/ML IJ SOLN
INTRAVENOUS | Status: DC | PRN
  Administered 2016-03-31: 50 ug/min via INTRAVENOUS

## 2016-03-31 MED ORDER — CEFAZOLIN IN D5W 1 GM/50ML IV SOLN
1.0000 g | Freq: Three times a day (TID) | INTRAVENOUS | Status: AC
  Administered 2016-03-31 – 2016-04-01 (×2): 1 g via INTRAVENOUS
  Filled 2016-03-31 (×2): qty 50

## 2016-03-31 MED ORDER — SODIUM CHLORIDE 0.9 % IJ SOLN
INTRAMUSCULAR | Status: DC | PRN
  Administered 2016-03-31: 60 mL

## 2016-03-31 MED ORDER — CEFAZOLIN SODIUM-DEXTROSE 2-4 GM/100ML-% IV SOLN
INTRAVENOUS | Status: AC
  Filled 2016-03-31: qty 100

## 2016-03-31 MED ORDER — DIPHENHYDRAMINE HCL 50 MG/ML IJ SOLN
INTRAMUSCULAR | Status: DC | PRN
  Administered 2016-03-31: 25 mg via INTRAVENOUS

## 2016-03-31 MED ORDER — SUGAMMADEX SODIUM 500 MG/5ML IV SOLN
INTRAVENOUS | Status: AC
  Filled 2016-03-31: qty 5

## 2016-03-31 MED ORDER — LIDOCAINE 2% (20 MG/ML) 5 ML SYRINGE
INTRAMUSCULAR | Status: AC
  Filled 2016-03-31: qty 5

## 2016-03-31 MED ORDER — SODIUM CHLORIDE 0.9 % IJ SOLN
INTRAMUSCULAR | Status: AC
  Filled 2016-03-31: qty 20

## 2016-03-31 MED ORDER — PROMETHAZINE HCL 25 MG/ML IJ SOLN: 6.2500 mg | INTRAMUSCULAR | Status: DC | PRN

## 2016-03-31 MED ORDER — ONDANSETRON HCL 4 MG/2ML IJ SOLN
4.0000 mg | INTRAMUSCULAR | Status: DC | PRN
  Administered 2016-04-03 (×2): 4 mg via INTRAVENOUS
  Filled 2016-03-31 (×3): qty 2

## 2016-03-31 MED ORDER — LACTATED RINGERS IR SOLN
Status: DC | PRN
  Administered 2016-03-31: 1000 mL

## 2016-03-31 MED ORDER — PROPOFOL 10 MG/ML IV BOLUS
INTRAVENOUS | Status: AC
  Filled 2016-03-31: qty 20

## 2016-03-31 MED ORDER — BUPIVACAINE-EPINEPHRINE (PF) 0.5% -1:200000 IJ SOLN
INTRAMUSCULAR | Status: AC
  Filled 2016-03-31: qty 30

## 2016-03-31 MED ORDER — FENTANYL CITRATE (PF) 100 MCG/2ML IJ SOLN
INTRAMUSCULAR | Status: DC | PRN
  Administered 2016-03-31: 100 ug via INTRAVENOUS
  Administered 2016-03-31 (×5): 50 ug via INTRAVENOUS

## 2016-03-31 MED ORDER — PHENYLEPHRINE HCL 10 MG/ML IJ SOLN
INTRAMUSCULAR | Status: AC
  Filled 2016-03-31: qty 1

## 2016-03-31 MED ORDER — CEFAZOLIN SODIUM-DEXTROSE 2-4 GM/100ML-% IV SOLN
2.0000 g | INTRAVENOUS | Status: AC
  Administered 2016-03-31: 2 g via INTRAVENOUS

## 2016-03-31 MED ORDER — ONDANSETRON HCL 4 MG/2ML IJ SOLN
INTRAMUSCULAR | Status: DC | PRN
  Administered 2016-03-31: 4 mg via INTRAVENOUS

## 2016-03-31 MED ORDER — FENTANYL CITRATE (PF) 100 MCG/2ML IJ SOLN
INTRAMUSCULAR | Status: AC
  Filled 2016-03-31: qty 2

## 2016-03-31 MED ORDER — HYDROMORPHONE HCL 1 MG/ML IJ SOLN
0.2500 mg | INTRAMUSCULAR | Status: DC | PRN
  Administered 2016-03-31 (×2): 0.5 mg via INTRAVENOUS
  Administered 2016-03-31 (×4): 0.25 mg via INTRAVENOUS

## 2016-03-31 MED ORDER — ROCURONIUM BROMIDE 50 MG/5ML IV SOSY
PREFILLED_SYRINGE | INTRAVENOUS | Status: AC
  Filled 2016-03-31: qty 5

## 2016-03-31 MED ORDER — SODIUM CHLORIDE 0.9 % IJ SOLN
INTRAMUSCULAR | Status: AC
  Filled 2016-03-31: qty 50

## 2016-03-31 MED ORDER — MIDAZOLAM HCL 5 MG/5ML IJ SOLN
INTRAMUSCULAR | Status: DC | PRN
  Administered 2016-03-31: 2 mg via INTRAVENOUS

## 2016-03-31 MED ORDER — 0.9 % SODIUM CHLORIDE (POUR BTL) OPTIME
TOPICAL | Status: DC | PRN
  Administered 2016-03-31: 1000 mL

## 2016-03-31 MED ORDER — DEXAMETHASONE SODIUM PHOSPHATE 4 MG/ML IJ SOLN
INTRAMUSCULAR | Status: DC | PRN
  Administered 2016-03-31: 10 mg via INTRAVENOUS

## 2016-03-31 MED ORDER — BUPIVACAINE HCL 0.25 % IJ SOLN
INTRAMUSCULAR | Status: DC | PRN
Start: 2016-03-31 — End: 2016-03-31
  Administered 2016-03-31: 9 mL

## 2016-03-31 MED ORDER — MIDAZOLAM HCL 2 MG/2ML IJ SOLN
INTRAMUSCULAR | Status: AC
  Filled 2016-03-31: qty 2

## 2016-03-31 MED ORDER — DIPHENHYDRAMINE HCL 50 MG/ML IJ SOLN: 12.5000 mg | Freq: Four times a day (QID) | INTRAMUSCULAR | Status: DC | PRN

## 2016-03-31 MED ORDER — CIPROFLOXACIN IN D5W 400 MG/200ML IV SOLN
400.0000 mg | INTRAVENOUS | Status: AC
  Administered 2016-03-31: 400 mg via INTRAVENOUS

## 2016-03-31 MED ORDER — ROCURONIUM BROMIDE 50 MG/5ML IV SOSY
PREFILLED_SYRINGE | INTRAVENOUS | Status: DC | PRN
  Administered 2016-03-31 (×2): 20 mg via INTRAVENOUS
  Administered 2016-03-31: 10 mg via INTRAVENOUS
  Administered 2016-03-31: 50 mg via INTRAVENOUS
  Administered 2016-03-31: 20 mg via INTRAVENOUS
  Administered 2016-03-31: 30 mg via INTRAVENOUS

## 2016-03-31 MED ORDER — PHENYLEPHRINE 40 MCG/ML (10ML) SYRINGE FOR IV PUSH (FOR BLOOD PRESSURE SUPPORT)
PREFILLED_SYRINGE | INTRAVENOUS | Status: DC | PRN
  Administered 2016-03-31: 80 ug via INTRAVENOUS
  Administered 2016-03-31: 120 ug via INTRAVENOUS
  Administered 2016-03-31 (×3): 80 ug via INTRAVENOUS
  Administered 2016-03-31: 120 ug via INTRAVENOUS

## 2016-03-31 MED ORDER — ALBUMIN HUMAN 5 % IV SOLN
INTRAVENOUS | Status: DC | PRN
  Administered 2016-03-31 (×2): via INTRAVENOUS

## 2016-03-31 MED ORDER — ACETAMINOPHEN 10 MG/ML IV SOLN
1000.0000 mg | Freq: Four times a day (QID) | INTRAVENOUS | Status: AC
Start: 2016-03-31 — End: 2016-04-01
  Administered 2016-03-31 – 2016-04-01 (×4): 1000 mg via INTRAVENOUS
  Filled 2016-03-31 (×4): qty 100

## 2016-03-31 MED ORDER — ONDANSETRON HCL 4 MG/2ML IJ SOLN
INTRAMUSCULAR | Status: AC
  Filled 2016-03-31: qty 2

## 2016-03-31 MED ORDER — KCL IN DEXTROSE-NACL 20-5-0.45 MEQ/L-%-% IV SOLN
INTRAVENOUS | Status: DC
  Administered 2016-03-31: 16:00:00 via INTRAVENOUS
  Filled 2016-03-31 (×2): qty 1000

## 2016-03-31 MED ORDER — ROCURONIUM BROMIDE 50 MG/5ML IV SOSY
PREFILLED_SYRINGE | INTRAVENOUS | Status: AC
  Filled 2016-03-31: qty 10

## 2016-03-31 MED ORDER — SENNA 8.6 MG PO TABS
1.0000 | ORAL_TABLET | Freq: Two times a day (BID) | ORAL | Status: DC
  Administered 2016-03-31 – 2016-04-04 (×8): 8.6 mg via ORAL
  Filled 2016-03-31 (×8): qty 1

## 2016-03-31 MED ORDER — BUPIVACAINE LIPOSOME 1.3 % IJ SUSP
20.0000 mL | Freq: Once | INTRAMUSCULAR | Status: AC
  Administered 2016-03-31: 20 mL
  Filled 2016-03-31: qty 20

## 2016-03-31 MED ORDER — PROPOFOL 10 MG/ML IV BOLUS
INTRAVENOUS | Status: DC | PRN
  Administered 2016-03-31: 200 mg via INTRAVENOUS

## 2016-03-31 MED ORDER — PHENYLEPHRINE 40 MCG/ML (10ML) SYRINGE FOR IV PUSH (FOR BLOOD PRESSURE SUPPORT)
PREFILLED_SYRINGE | INTRAVENOUS | Status: AC
  Filled 2016-03-31: qty 20

## 2016-03-31 MED ORDER — DOCUSATE SODIUM 100 MG PO CAPS
100.0000 mg | ORAL_CAPSULE | Freq: Two times a day (BID) | ORAL | Status: DC
  Administered 2016-03-31 – 2016-04-04 (×8): 100 mg via ORAL
  Filled 2016-03-31 (×8): qty 1

## 2016-03-31 MED ORDER — FENTANYL CITRATE (PF) 250 MCG/5ML IJ SOLN
INTRAMUSCULAR | Status: AC
  Filled 2016-03-31: qty 5

## 2016-03-31 MED ORDER — LIDOCAINE 2% (20 MG/ML) 5 ML SYRINGE
INTRAMUSCULAR | Status: DC | PRN
  Administered 2016-03-31: 100 mg via INTRAVENOUS

## 2016-03-31 MED ORDER — DIPHENHYDRAMINE HCL 50 MG/ML IJ SOLN
INTRAMUSCULAR | Status: AC
  Filled 2016-03-31: qty 1

## 2016-03-31 MED ORDER — HYDROMORPHONE HCL 1 MG/ML IJ SOLN
0.5000 mg | INTRAMUSCULAR | Status: DC | PRN
  Administered 2016-03-31 – 2016-04-01 (×5): 0.5 mg via INTRAVENOUS
  Administered 2016-04-03 (×2): 1 mg via INTRAVENOUS
  Filled 2016-03-31: qty 0.5
  Filled 2016-03-31 (×2): qty 1
  Filled 2016-03-31 (×2): qty 0.5
  Filled 2016-03-31: qty 1
  Filled 2016-03-31 (×2): qty 0.5

## 2016-03-31 SURGICAL SUPPLY — 59 items
APPLICATOR ARISTA FLEXITIP XL (MISCELLANEOUS) IMPLANT
APPLICATOR SURGIFLO ENDO (HEMOSTASIS) IMPLANT
APPLIER CLIP ROT 10 11.4 M/L (STAPLE)
BAG LAPAROSCOPIC 12 15 PORT 16 (BASKET) ×1 IMPLANT
BAG RETRIEVAL 12/15 (BASKET) ×2
BAG ZIPLOCK 12X15 (MISCELLANEOUS) ×2 IMPLANT
BLADE EXTENDED COATED 6.5IN (ELECTRODE) IMPLANT
BLADE SURG SZ10 CARB STEEL (BLADE) IMPLANT
CHLORAPREP W/TINT 26ML (MISCELLANEOUS) ×2 IMPLANT
CLIP APPLIE ROT 10 11.4 M/L (STAPLE) IMPLANT
CLIP LIGATING HEM O LOK PURPLE (MISCELLANEOUS) ×6 IMPLANT
CLIP LIGATING HEMO LOK XL GOLD (MISCELLANEOUS) IMPLANT
CLIP LIGATING HEMO O LOK GREEN (MISCELLANEOUS) ×4 IMPLANT
COVER SURGICAL LIGHT HANDLE (MISCELLANEOUS) ×2 IMPLANT
CUTTER FLEX LINEAR 45M (STAPLE) ×2 IMPLANT
DECANTER SPIKE VIAL GLASS SM (MISCELLANEOUS) ×2 IMPLANT
DERMABOND ADVANCED (GAUZE/BANDAGES/DRESSINGS) ×1
DERMABOND ADVANCED .7 DNX12 (GAUZE/BANDAGES/DRESSINGS) ×1 IMPLANT
DRAPE INCISE IOBAN 66X45 STRL (DRAPES) ×2 IMPLANT
DRAPE WARM FLUID 44X44 (DRAPE) IMPLANT
ELECT PENCIL ROCKER SW 15FT (MISCELLANEOUS) ×2 IMPLANT
ELECT REM PT RETURN 15FT ADLT (MISCELLANEOUS) IMPLANT
GLOVE BIOGEL M STRL SZ7.5 (GLOVE) ×2 IMPLANT
GOWN STRL REUS W/TWL LRG LVL3 (GOWN DISPOSABLE) ×6 IMPLANT
HEMOSTAT ARISTA ABSORB 3G PWDR (MISCELLANEOUS) IMPLANT
HEMOSTAT SURGICEL 2X14 (HEMOSTASIS) ×2 IMPLANT
HEMOSTAT SURGICEL 4X8 (HEMOSTASIS) ×2 IMPLANT
IRRIG SUCT STRYKERFLOW 2 WTIP (MISCELLANEOUS) ×2
IRRIGATION SUCT STRKRFLW 2 WTP (MISCELLANEOUS) ×1 IMPLANT
KIT BASIN OR (CUSTOM PROCEDURE TRAY) ×2 IMPLANT
MANIFOLD NEPTUNE II (INSTRUMENTS) ×2 IMPLANT
PAD POSITIONING PINK XL (MISCELLANEOUS) ×2 IMPLANT
POSITIONER SURGICAL ARM (MISCELLANEOUS) ×4 IMPLANT
RELOAD 45 VASCULAR/THIN (ENDOMECHANICALS) ×10 IMPLANT
RELOAD STAPLE TA45 3.5 REG BLU (ENDOMECHANICALS) IMPLANT
RETRACTOR LAPSCP 12X46 CVD (ENDOMECHANICALS) IMPLANT
RTRCTR LAPSCP 12X46 CVD (ENDOMECHANICALS)
SCISSORS LAP 5X35 DISP (ENDOMECHANICALS) IMPLANT
SHEARS HARMONIC ACE PLUS 36CM (ENDOMECHANICALS) ×2 IMPLANT
SLEEVE XCEL OPT CAN 5 100 (ENDOMECHANICALS) ×4 IMPLANT
SPONGE LAP 4X18 X RAY DECT (DISPOSABLE) ×2 IMPLANT
SPONGE SURGIFOAM ABS GEL 100 (HEMOSTASIS) IMPLANT
SUT MNCRL AB 4-0 PS2 18 (SUTURE) ×4 IMPLANT
SUT PDS AB 0 CT1 36 (SUTURE) ×6 IMPLANT
SUT VIC AB 0 CT1 27 (SUTURE) ×1
SUT VIC AB 0 CT1 27XBRD ANTBC (SUTURE) ×1 IMPLANT
SUT VIC AB 2-0 CT1 27 (SUTURE) ×1
SUT VIC AB 2-0 CT1 27XBRD (SUTURE) ×1 IMPLANT
SUT VICRYL 0 UR6 27IN ABS (SUTURE) IMPLANT
TAPE CLOTH 4X10 WHT NS (GAUZE/BANDAGES/DRESSINGS) ×2 IMPLANT
TOWEL OR 17X26 10 PK STRL BLUE (TOWEL DISPOSABLE) ×2 IMPLANT
TOWEL OR NON WOVEN STRL DISP B (DISPOSABLE) ×2 IMPLANT
TRAY FOLEY CATH 16FR SILVER (SET/KITS/TRAYS/PACK) ×2 IMPLANT
TRAY LAPAROSCOPIC (CUSTOM PROCEDURE TRAY) ×2 IMPLANT
TROCAR BLADELESS OPT 5 100 (ENDOMECHANICALS) ×2 IMPLANT
TROCAR UNIVERSAL OPT 12M 100M (ENDOMECHANICALS) ×2 IMPLANT
TROCAR XCEL 12X100 BLDLESS (ENDOMECHANICALS) ×2 IMPLANT
TUBING INSUF HEATED (TUBING) ×2 IMPLANT
YANKAUER SUCT BULB TIP 10FT TU (MISCELLANEOUS) IMPLANT

## 2016-03-31 NOTE — Progress Notes (Signed)
Pt ambulated around 100 feet in hallway this shift with minimal assistance. Tolerated well until end of walk. Patient suddenly became very dizzy and c/o chest tightness. Pt also became visibly pale. VSS- 118/71, HR 88, 100% RA. Pt was assisted to a chair immediately in the hallway and stated he felt better after sitting. Color returned to patient's face. Pt was pushed back to his room in his chair and is currently sitting up in chair, states he feels better. Girlfriend at bedside.

## 2016-03-31 NOTE — Progress Notes (Signed)
CBC and C- met drawn by lab.

## 2016-03-31 NOTE — Transfer of Care (Signed)
Immediate Anesthesia Transfer of Care Note  Patient: Shawn Estrada  Procedure(s) Performed: Procedure(s): RIGHT LAPAROSCOPIC RADICAL NEPHRECTOMY (Right)  Patient Location: PACU  Anesthesia Type:General  Level of Consciousness: Patient easily awoken, sedated, comfortable, cooperative, following commands, responds to stimulation.   Airway & Oxygen Therapy: Patient spontaneously breathing, ventilating well, oxygen via simple oxygen mask.  Post-op Assessment: Report given to PACU RN, vital signs reviewed and stable, moving all extremities.   Post vital signs: Reviewed and stable.  Complications: No apparent anesthesia complications Last Vitals:  Vitals:   03/31/16 0530 03/31/16 1300  BP: (!) 149/88 137/69  Pulse: (!) 103 (!) 108  Resp: 18 16  Temp: 36.9 C 37.2 C    Last Pain:  Vitals:   03/31/16 0530  TempSrc: Oral         Complications: No apparent anesthesia complications

## 2016-03-31 NOTE — Anesthesia Procedure Notes (Signed)
Procedure Name: Intubation Date/Time: 03/31/2016 7:38 AM Performed by: Deliah Boston Pre-anesthesia Checklist: Patient identified, Emergency Drugs available, Suction available and Patient being monitored Patient Re-evaluated:Patient Re-evaluated prior to inductionOxygen Delivery Method: Circle system utilized Preoxygenation: Pre-oxygenation with 100% oxygen Intubation Type: IV induction Ventilation: Mask ventilation without difficulty Laryngoscope Size: Mac and 4 Grade View: Grade I Tube type: Oral Tube size: 7.5 mm Number of attempts: 1 Airway Equipment and Method: Stylet and Oral airway Placement Confirmation: ETT inserted through vocal cords under direct vision,  positive ETCO2 and breath sounds checked- equal and bilateral Secured at: 22 cm Tube secured with: Tape Dental Injury: Teeth and Oropharynx as per pre-operative assessment

## 2016-03-31 NOTE — Discharge Summary (Signed)
Physician Discharge Summary  Patient ID: Shawn Estrada MRN: [id number] DOB/AGE: 04/17/1975 41 y.o.  Admit date: 03/31/2016 Discharge date: 04/04/2016  Admission Diagnoses: Right renal mass  Discharge Diagnoses: Right renal mass  Discharged Condition: good  Hospital Course:  Shawn Estrada is a 41 y.o. male who is s/p right laparoscopic radical nephrectomy on 03/31/2016 with Dr. Louis Meckel for right renal mass.  The patient tolerated the procedure well, was extubated in the OR and taken to the recovery unit for routine postoperative care. They were then transferred to the floor. He was transfused 1U pRBC on POD2 for Hb 7.5.   By Dellie Burns he had met the usual goals for discharge including ambulating at a preoperative capacity, voiding spontaneously, having pain controlled with PO PRN medications, and tolerating a regular diet.  The patient will follow up with [facility] on 4/6. They will be discharged with prescriptions for tramadol & colace.    Consults: None  Significant Diagnostic Studies: labs: Hb on POD4 8.9 from 9.7 POD3 with no symptoms and normal VS on morning of POD4. Cr 1.5 on day of discharge.  Treatments: surgery: as noted above  Discharge Exam: Blood pressure 109/66, pulse 95, temperature 97.6 F (36.4 C), temperature source Oral, resp. rate 18, height '6\' 2"'$  (1.88 m), weight 106.1 kg (234 lb), SpO2 97 %.  General:  well-developed and well-nourished male in NAD, lying in bed, alert & oriented, pleasant HEENT: [place]/AT, EOMI, sclera anicteric, hearing grossly intact, no nasal discharge, MMM Respiratory: nonlabored respirations, satting well on RA, symmetrical chest rise Cardiovascular: pulse regular rate & rhythm Abdominal: soft, NTTP, nondistended, surgical incisions c/d/i without signs of exudate/erythema GU: voiding spontaneously Extremities: warm, well-perfused, no c/c/e Neuro: no focal deficits   Disposition: 01-Home or Self Care  Discharge Instructions    Activity as tolerated - No restrictions    Complete by:  As directed    Call MD for:    Complete by:  As directed    Temperature >101.5   Call MD for:  persistant nausea and vomiting    Complete by:  As directed    Call MD for:  redness, tenderness, or signs of infection (pain, swelling, redness, odor or green/yellow discharge around incision site)    Complete by:  As directed    Call MD for:  severe uncontrolled pain    Complete by:  As directed    Diet general    Complete by:  As directed    Increase activity slowly    Complete by:  As directed    No wound care    Complete by:  As directed      Allergies as of 04/04/2016   No Known Allergies     Medication List    STOP taking these medications   ciprofloxacin 500 MG tablet Commonly known as:  CIPRO   oxyCODONE-acetaminophen 5-325 MG tablet Commonly known as:  ROXICET     TAKE these medications   ferrous sulfate 325 (65 FE) MG tablet Take 1 tablet (325 mg total) by mouth 3 (three) times daily with meals.   lamoTRIgine 100 MG tablet Commonly known as:  LAMICTAL Take 1 tablet (100 mg total) by mouth daily.   omeprazole 20 MG capsule Commonly known as:  PRILOSEC Take 20 mg by mouth daily.      Follow-up Information    Ardis Hughs, MD Follow up on 04/15/2016.   Specialty:  Urology Why:  10am Contact information: [place]  [place]           Signed: Burnice Logan 04/04/2016, 7:39 AM

## 2016-03-31 NOTE — H&P (View-Only) (Signed)
I have been asked to see the patient by Dr. Larae Grooms, for evaluation and management of right renal mass.  History of present illness: 41 year old male who presented to the ER with right-sided flank pain and gross hematuria. He had had some hematuria earlier but the pain became intense. In the emergency room he was found to have gross hematuria with no clear evidence of infection. His white blood cell count was normal, his hemoglobin was 14.6 and he had a slight elevation in his creatinine.  The patient was noted to have a 12 cm right lower pole renal mass with extension into the renal pelvis. A repeat IV contrasted and CT scan demonstrated renal vein invasion with no IVC invasion. There were some small pulmonary nodules, otherwise nonspecific. There is no evidence of metastatic disease in other areas. The patient was in 10/10 pain, with IV pain medication his pain went back to 1.  The patient has no significant past medical history except for acid reflux and anxiety/depression.  The patient has a history of laparoscopic cholecystectomy.  The patient has a family history significant for rhabdomyosarcoma and his grandmother and history of endometrial cancer. There is no GU malignancies noted.  Review of systems: A 12 point comprehensive review of systems was obtained and is negative unless otherwise stated in the history of present illness.  There are no active problems to display for this patient.   No current facility-administered medications on file prior to encounter.    Current Outpatient Prescriptions on File Prior to Encounter  Medication Sig Dispense Refill  . lamoTRIgine (LAMICTAL) 100 MG tablet Take 1 tablet (100 mg total) by mouth daily. 30 tablet 1  . omeprazole (PRILOSEC) 20 MG capsule Take 20 mg by mouth daily.      Past Medical History:  Diagnosis Date  . Anxiety   . Depression   . Hypertension     Past Surgical History:  Procedure Laterality Date  .  CHOLECYSTECTOMY      Social History  Substance Use Topics  . Smoking status: Current Some Day Smoker    Packs/day: 0.25    Types: Cigarettes    Start date: 10/23/1989  . Smokeless tobacco: Current User    Types: Snuff     Comment: States only smokes when he drinks occasionally   . Alcohol use 0.0 - 4.2 oz/week     Comment: Occasional use    Family History  Problem Relation Age of Onset  . Depression Sister     PE: Vitals:   03/26/16 0854 03/26/16 1000  BP: (!) 156/91 137/89  Pulse: 92 73  Resp: 16   Temp: 97.6 F (36.4 C)   TempSrc: Oral   SpO2: 98% 92%  Weight: 108.9 kg (240 lb)   Height: 6\' 2"  (1.88 m)    Patient appears to be in no acute distress  patient is alert and oriented x3 Atraumatic normocephalic head No cervical or supraclavicular lymphadenopathy appreciated No increased work of breathing, no audible wheezes/rhonchi Regular sinus rhythm/rate Abdomen is soft, nontender, nondistended, no CVA or suprapubic tenderness There is a palpable mass in the right upper quadrant likely related to the mass emanating from the right lower pole. The patient has laparoscopic incisions from his recent cholecystectomy. Lower extremities are symmetric without appreciable edema Grossly neurologically intact No identifiable skin lesions   Recent Labs  03/26/16 0906  WBC 8.3  HGB 14.6  HCT 42.5    Recent Labs  03/26/16 0906  NA 137  K  4.1  CL 102  CO2 26  GLUCOSE 103*  BUN 14  CREATININE 1.33*  CALCIUM 9.1   No results for input(s): LABPT, INR in the last 72 hours. No results for input(s): LABURIN in the last 72 hours. No results found for this or any previous visit.  Imaging: I have independently reviewed the images from hsi CT scans with the findings as described in the HPI.  The official read is pending.  Imp: Large right lower pole renal mass with invasion into the renal vein and renal pelvis.  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  Pulmonary nodules are  non-specific and will be followed closely.  Recommendations: I went over the findings with the patient and his family.  I discussed the likelihood that this is renal cell cancer and recommended surgical extirpation.  I went over the likely approach of a laparoscopic radical nephrectomy.  I discussed the surgery in detail as well as the risk/benefits.  We discussed the expected hospital recovery as well as the time of recovery prior to returning to work.  I will work to get this scheduled in the next 7-14 days.   Louis Meckel W

## 2016-03-31 NOTE — Anesthesia Preprocedure Evaluation (Addendum)
Anesthesia Evaluation  Patient identified by MRN, date of birth, ID band Patient awake    History of Anesthesia Complications (+) PONV  Airway Mallampati: II  TM Distance: >3 FB Neck ROM: Full    Dental no notable dental hx. (+) Teeth Intact   Pulmonary Current Smoker,    breath sounds clear to auscultation       Cardiovascular hypertension,  Rhythm:Regular Rate:Normal     Neuro/Psych    GI/Hepatic GERD  ,  Endo/Other    Renal/GU      Musculoskeletal   Abdominal   Peds  Hematology   Anesthesia Other Findings   Reproductive/Obstetrics                           Anesthesia Physical Anesthesia Plan  ASA: II  Anesthesia Plan: General   Post-op Pain Management:    Induction: Intravenous  Airway Management Planned: Oral ETT  Additional Equipment:   Intra-op Plan:   Post-operative Plan: Extubation in OR  Informed Consent: I have reviewed the patients History and Physical, chart, labs and discussed the procedure including the risks, benefits and alternatives for the proposed anesthesia with the patient or authorized representative who has indicated his/her understanding and acceptance.   Dental advisory given  Plan Discussed with:   Anesthesia Plan Comments:         Anesthesia Quick Evaluation

## 2016-03-31 NOTE — Interval H&P Note (Signed)
History and Physical Interval Note:  03/31/2016 7:09 AM  Shawn Estrada  has presented today for surgery, with the diagnosis of RIGHT RENAL MASS  The various methods of treatment have been discussed with the patient and family. After consideration of risks, benefits and other options for treatment, the patient has consented to  Procedure(s): RIGHT LAPAROSCOPIC RADICAL NEPHRECTOMY (Right) as a surgical intervention .  The patient's history has been reviewed, patient examined, no change in status, stable for surgery.  I have reviewed the patient's chart and labs.  Questions were answered to the patient's satisfaction.     Louis Meckel W

## 2016-03-31 NOTE — Op Note (Signed)
Preoperative diagnosis:  1. Right renal mass   Postoperative diagnosis:  1. Same   Procedure: 1. Laparoscopic right radical nephrectomy  Surgeon: Ardis Hughs, MD Resident Assistant: Cleotis Lema, MD First Assistant: None  Anesthesia: General  Complications: None  Intraoperative findings: Very large lower pole 12cm mass with distortion and upward deviation of the renal hilum. Significant parasitic vessels at the lower pole. Close proximity of tumor to the right psoas muscle. Oozing from the parasitic veins throughout procedure.   EBL: 1500cc  Specimens:  Right kidney and proximal ureter  Indication: Shawn Estrada is a 41 y.o. 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  After reviewing the management options for treatment, he elected to proceed with the above surgical procedure(s). We have discussed the potential benefits and risks of the procedure, side effects of the proposed treatment, the likelihood of the patient achieving the goals of the procedure, and any potential problems that might occur during the procedure or recuperation. Informed consent has been obtained.  Description of procedure:  A site was selected lateral to the umbilicus for placement of the camera port. This was placed using a standard open Hassan technique which allowed entry into the peritoneal cavity under direct vision and without difficulty. A 12 mm Hassan cannula was placed and a pneumoperitoneum established. The camera was then used to inspect the abdomen and there was no evidence of any intra-abdominal injuries or other abnormalities. The remaining abdominal ports were then placed under visual guidance.  A second 12 mm port was placed in the right lower quadrant approximately 8 cm away from the camera. A 5 mm port was placed in the right upper quadrant again 8 cm away from the camera. A second 5 mm  port was placed just inferior to the xiphoid process in the midline, this was used as a liver retractor.  An additional 5 mm port was then placed in the right lower quadrant at the anterior axillary line lateral to the 12 mm port. All ports were placed under direct vision without difficulty.   The white line of Toldt was incised allowing the colon to be mobilized medially and the plane between the mesocolon and the anterior layer of Gerota's fascia to be developed and the kidney was exposed. The right renal mass was obvious. The appendix was actually looped up and adhered to the lower aspect of the right kidney mass and was carefully dissected off of the kidney. The ureter and gonadal vein were identified inferiorly and the ureter was lifted anteriorly off the psoas muscle. Dissection proceeded superiorly along the gonadal vein. The gonadal vein and gonadal artery were identified during our dissection and were clipped and divided.   Significant oozing from parasitic veins were noted during the case with our attempts to reach the hilum which was distorted and pushed more cephalad than expected secondary to the renal mass. The renal hilum was then isolated with a combination of blunt and sharp dissection allowing the renal arterial and venous structures to be separated and isolated. A vessel near the right renal hilum, never identified concretely but may have been a main renal vessel, significantly oozed at one point during our dissection of the hilum. The decision was made to staple the remaining hilar packet at this point. The hilum was ligated and divided with multiple 79mm vascular staple loads. Hemostasis was again achieved. Surgicel was applied to the hilum and to the adrenal  tissue that had been divided. Lap pads were used near the lower pole renal mass to assist with hemostasis secondary to parasitic vessel ooze.  Gerota's fascia was intentionally entered superiorly and the kidney was developed. The  adrenal gland was likely partially spared during the procedure. The hepatorenal ligaments were divided. The lateral and posterior attachments to the kidney were then divided. Once the kidney was free from its attachments 80cc of exparel diluted in saline was then injected into the right anterior axillary line b/w the iliac crest and the twelfth rib under laparoscopic guidance. The layer between the tranversus abdominus and the internal oblique was targeted.   The kidney/ureter specimen was then placed into a 15 mm Endocatch II retrieval bag. The renal hilum, liver, adrenal bed and gonadal vein areas were each inspected and hemostasis was ensured with the pneomperitoneal pressures lowered. The camera was then brought to the 12 mm port laterally and the camera port was removed and the fascia closed using a with 0 Vicryl.  All the ports were then removed under visual guidance. The lateral 12 mm and 5 mm ports were then connected sharply with a 15 blade. We then opened this incision down to the external oblique fascia. We then spread the muscle fibers down to the internal oblique fascia which we then opened with cautery. These were then spread in all muscle spared and the posterior peritoneum was opened. The rectus muscle was pulled medially. The specimen was then removed through this incision. The internal oblique fascia was then closed with a 0 Vicryl in a running fashion. The external oblique fascia was then closed with a 0 PDS in a running fashion. The subcutaneous tissue was closed with 2-0 vicryl in interrupted fashion.  All incisions were injected with local anesthetic and reapproximated at the skin with 4-0 monocryl sutures. Dermabond was applied to the skin. The patient tolerated the procedure well and without complications and was transferred to the recovery unit in satisfactory condition.

## 2016-04-01 ENCOUNTER — Ambulatory Visit: Payer: 59 | Admitting: Licensed Clinical Social Worker

## 2016-04-01 LAB — BASIC METABOLIC PANEL
ANION GAP: 7 (ref 5–15)
BUN: 15 mg/dL (ref 6–20)
CHLORIDE: 104 mmol/L (ref 101–111)
CO2: 26 mmol/L (ref 22–32)
CREATININE: 1.59 mg/dL — AB (ref 0.61–1.24)
Calcium: 8.5 mg/dL — ABNORMAL LOW (ref 8.9–10.3)
GFR calc non Af Amer: 52 mL/min — ABNORMAL LOW (ref >60)
GLUCOSE: 136 mg/dL — AB (ref 65–99)
Potassium: 4.4 mmol/L (ref 3.5–5.1)
Sodium: 137 mmol/L (ref 135–145)

## 2016-04-01 LAB — HEMOGLOBIN AND HEMATOCRIT, BLOOD
HEMATOCRIT: 26.2 % — AB (ref 39.0–52.0)
Hemoglobin: 9 g/dL — ABNORMAL LOW (ref 13.0–17.0)

## 2016-04-01 LAB — URINE CULTURE: Culture: NO GROWTH

## 2016-04-01 MED ORDER — FERROUS SULFATE 325 (65 FE) MG PO TABS: 325.0000 mg | ORAL_TABLET | Freq: Three times a day (TID) | ORAL | 0 refills | Status: DC

## 2016-04-01 NOTE — Progress Notes (Signed)
UROLOGY PROGRESS NOTES  Assessment/Plan: Shawn Estrada is a 41 y.o. male with a history of right renal mass, depression, GERD, HTN, OSA on CPAP s/p right lap radical nephrectomy 03/31/16 with Dr. Louis Meckel.   Interval/Plan: NAEON. AFVSS. Adequate UOP via Foley. Cr 1.5 from 1.7 in PACU. Hb stable at 9.0.  - Discontinue Foley, TOV - Regular diet - Saline lock  - OOB/ambulate/IS - Possible discharge later today vs tomorrow   Subjective: Dizzy with standing and ambulating. No nausea/emesis. Tolerating clears. No flatus or BM. Some expected pain postop but manageable, also having bilateral shoulder pain.   Objective:  Vital signs in last 24 hours: Temp:  [97.6 F (36.4 C)-99 F (37.2 C)] 97.8 F (36.6 C) (03/23 0603) Pulse Rate:  [70-108] 72 (03/23 0603) Resp:  [9-20] 20 (03/23 0603) BP: (107-144)/(67-80) 144/76 (03/23 0603) SpO2:  [92 %-100 %] 99 % (03/23 0603)  03/22 0701 - 03/23 0700 In: 7043.8 [P.O.:600; I.V.:5293.8; IV Piggyback:950] Out: 2585 [Urine:2360; Blood:1500]    Physical Exam:  General:  well-developed and well-nourished male in NAD, lying in bed, alert & oriented, pleasant HEENT: [place]/AT, EOMI, sclera anicteric, hearing grossly intact, no nasal discharge, MMM Respiratory: nonlabored respirations, satting well on RA, symmetrical chest rise Cardiovascular: pulse regular rate & rhythm Abdominal: soft, mildly & appropriately TTP, nondistended, surgical incisions c/d/i without signs of exudate/erythema GU: Foley has already been removed, was draining light clear yellow urine yesterday postoperatively Extremities: warm, well-perfused, no c/c/e Neuro: no focal deficits   Data Review: Results for orders placed or performed during the hospital encounter of 03/31/16 (from the past 24 hour(s))  Prepare RBC     Status: None   Collection Time: 03/31/16 10:32 AM  Result Value Ref Range   Order Confirmation ORDER PROCESSED BY BLOOD BANK   I-STAT 4, (NA,K, GLUC, HGB,HCT)      Status: Abnormal   Collection Time: 03/31/16 11:09 AM  Result Value Ref Range   Sodium 134 (L) 135 - 145 mmol/L   Potassium 4.4 3.5 - 5.1 mmol/L   Glucose, Bld 169 (H) 65 - 99 mg/dL   HCT 27.0 (L) 39.0 - 52.0 %   Hemoglobin 9.2 (L) 13.0 - 17.0 g/dL  Comprehensive metabolic panel     Status: Abnormal   Collection Time: 03/31/16  1:09 PM  Result Value Ref Range   Sodium 135 135 - 145 mmol/L   Potassium 4.2 3.5 - 5.1 mmol/L   Chloride 102 101 - 111 mmol/L   CO2 23 22 - 32 mmol/L   Glucose, Bld 188 (H) 65 - 99 mg/dL   BUN 20 6 - 20 mg/dL   Creatinine, Ser 1.77 (H) 0.61 - 1.24 mg/dL   Calcium 8.0 (L) 8.9 - 10.3 mg/dL   Total Protein 6.4 (L) 6.5 - 8.1 g/dL   Albumin 3.5 3.5 - 5.0 g/dL   AST 39 15 - 41 U/L   ALT 38 17 - 63 U/L   Alkaline Phosphatase 56 38 - 126 U/L   Total Bilirubin 1.1 0.3 - 1.2 mg/dL   GFR calc non Af Amer 46 (L) >60 mL/min   GFR calc Af Amer 53 (L) >60 mL/min   Anion gap 10 5 - 15  CBC     Status: Abnormal   Collection Time: 03/31/16  1:09 PM  Result Value Ref Range   WBC 11.1 (H) 4.0 - 10.5 K/uL   RBC 3.26 (L) 4.22 - 5.81 MIL/uL   Hemoglobin 9.7 (L) 13.0 - 17.0 g/dL  HCT 28.1 (L) 39.0 - 52.0 %   MCV 86.2 78.0 - 100.0 fL   MCH 29.8 26.0 - 34.0 pg   MCHC 34.5 30.0 - 36.0 g/dL   RDW 12.5 11.5 - 15.5 %   Platelets 232 150 - 400 K/uL  Basic metabolic panel     Status: Abnormal   Collection Time: 04/01/16  4:51 AM  Result Value Ref Range   Sodium 137 135 - 145 mmol/L   Potassium 4.4 3.5 - 5.1 mmol/L   Chloride 104 101 - 111 mmol/L   CO2 26 22 - 32 mmol/L   Glucose, Bld 136 (H) 65 - 99 mg/dL   BUN 15 6 - 20 mg/dL   Creatinine, Ser 1.59 (H) 0.61 - 1.24 mg/dL   Calcium 8.5 (L) 8.9 - 10.3 mg/dL   GFR calc non Af Amer 52 (L) >60 mL/min   GFR calc Af Amer >60 >60 mL/min   Anion gap 7 5 - 15  Hemoglobin and hematocrit, blood     Status: Abnormal   Collection Time: 04/01/16  4:51 AM  Result Value Ref Range   Hemoglobin 9.0 (L) 13.0 - 17.0 g/dL   HCT 26.2  (L) 39.0 - 52.0 %    Imaging: None

## 2016-04-01 NOTE — Anesthesia Postprocedure Evaluation (Addendum)
Anesthesia Post Note  Patient: Shawn Estrada  Procedure(s) Performed: Procedure(s) (LRB): RIGHT LAPAROSCOPIC RADICAL NEPHRECTOMY (Right)  Patient location during evaluation: PACU Anesthesia Type: General Level of consciousness: awake, sedated and oriented Pain management: pain level controlled Vital Signs Assessment: post-procedure vital signs reviewed and stable Respiratory status: spontaneous breathing, nonlabored ventilation, respiratory function stable and patient connected to nasal cannula oxygen Cardiovascular status: blood pressure returned to baseline and stable Postop Assessment: no signs of nausea or vomiting Anesthetic complications: no       Last Vitals:  Vitals:   04/01/16 0603 04/01/16 0831  BP: (!) 144/76 (!) 163/82  Pulse: 72 76  Resp: 20 18  Temp: 36.6 C 36.7 C    Last Pain:  Vitals:   04/01/16 0852  TempSrc:   PainSc: 8                  Sinai Mahany,JAMES TERRILL

## 2016-04-01 NOTE — Care Management Note (Signed)
Case Management Note  Patient Details  Name: Shawn Estrada MRN: [id number] Date of Birth: June 15, 1975  Subjective/Objective:   41 y/o m admitted w/renal mass. POD#1 lap rad nephrectomy. From home.                 Action/Plan:d/c plan home.   Expected Discharge Date:                  Expected Discharge Plan:  Home/Self Care  In-House Referral:     Discharge planning Services  CM Consult  Post Acute Care Choice:    Choice offered to:     DME Arranged:    DME Agency:     HH Arranged:    HH Agency:     Status of Service:  In process, will continue to follow  If discussed at Long Length of Stay Meetings, dates discussed:    Additional Comments:  Dessa Phi, RN 04/01/2016, 11:39 AM

## 2016-04-02 LAB — BASIC METABOLIC PANEL WITH GFR
Anion gap: 5 (ref 5–15)
BUN: 14 mg/dL (ref 6–20)
CO2: 27 mmol/L (ref 22–32)
Calcium: 8.2 mg/dL — ABNORMAL LOW (ref 8.9–10.3)
Chloride: 103 mmol/L (ref 101–111)
Creatinine, Ser: 1.53 mg/dL — ABNORMAL HIGH (ref 0.61–1.24)
GFR calc Af Amer: >60 mL/min
GFR calc non Af Amer: 55 mL/min — ABNORMAL LOW
Glucose, Bld: 108 mg/dL — ABNORMAL HIGH (ref 65–99)
Potassium: 3.8 mmol/L (ref 3.5–5.1)
Sodium: 135 mmol/L (ref 135–145)

## 2016-04-02 LAB — BPAM RBC
BLOOD PRODUCT EXPIRATION DATE: 201804152359
Blood Product Expiration Date: 201804152359
Unit Type and Rh: 6200
Unit Type and Rh: 6200

## 2016-04-02 LAB — TYPE AND SCREEN
ABO/RH(D): A POS
ANTIBODY SCREEN: NEGATIVE
UNIT DIVISION: 0
Unit division: 0

## 2016-04-02 LAB — IRON AND TIBC
Iron: 9 ug/dL — ABNORMAL LOW (ref 45–182)
Saturation Ratios: 3 % — ABNORMAL LOW (ref 17.9–39.5)
TIBC: 305 ug/dL (ref 250–450)
UIBC: 296 ug/dL

## 2016-04-02 LAB — HEMOGLOBIN AND HEMATOCRIT, BLOOD
HCT: 21.7 % — ABNORMAL LOW (ref 39.0–52.0)
HCT: 21.6 % — ABNORMAL LOW (ref 39.0–52.0)
HEMATOCRIT: 26.6 % — AB (ref 39.0–52.0)
HEMOGLOBIN: 7.5 g/dL — AB (ref 13.0–17.0)
Hemoglobin: 7.6 g/dL — ABNORMAL LOW (ref 13.0–17.0)
Hemoglobin: 9.4 g/dL — ABNORMAL LOW (ref 13.0–17.0)

## 2016-04-02 LAB — FERRITIN: Ferritin: 146 ng/mL (ref 24–336)

## 2016-04-02 LAB — FOLATE: Folate: 8.2 ng/mL (ref >5.9)

## 2016-04-02 LAB — VITAMIN B12: VITAMIN B 12: 265 pg/mL (ref 180–914)

## 2016-04-02 MED ORDER — ACETAMINOPHEN 325 MG PO TABS
650.0000 mg | ORAL_TABLET | Freq: Once | ORAL | Status: AC
  Administered 2016-04-02: 650 mg via ORAL
  Filled 2016-04-02: qty 2

## 2016-04-02 MED ORDER — DIPHENHYDRAMINE HCL 25 MG PO CAPS
25.0000 mg | ORAL_CAPSULE | Freq: Once | ORAL | Status: AC
  Administered 2016-04-02: 25 mg via ORAL
  Filled 2016-04-02: qty 1

## 2016-04-02 MED ORDER — ALUM & MAG HYDROXIDE-SIMETH 200-200-20 MG/5ML PO SUSP
15.0000 mL | Freq: Once | ORAL | Status: AC
  Administered 2016-04-02: 15 mL via ORAL
  Filled 2016-04-02: qty 30

## 2016-04-02 MED ORDER — RANITIDINE HCL 150 MG/10ML PO SYRP
75.0000 mg | ORAL_SOLUTION | Freq: Two times a day (BID) | ORAL | Status: DC | PRN
  Administered 2016-04-02: 75 mg via ORAL
  Filled 2016-04-02 (×3): qty 10

## 2016-04-02 MED ORDER — SODIUM CHLORIDE 0.9 % IV SOLN
Freq: Once | INTRAVENOUS | Status: AC
  Administered 2016-04-02: 17:00:00 via INTRAVENOUS

## 2016-04-02 NOTE — Progress Notes (Signed)
2 Days Post-Op Subjective: Patient reports he still feels dizzy.  HR up to 109. In the 90's now while he's in bed. Voiding normally. Passing flatus.   Objective: Vital signs in last 24 hours: Temp:  [97.8 F (36.6 C)-98.8 F (37.1 C)] 98.8 F (37.1 C) (03/24 0436) Pulse Rate:  [87-109] 109 (03/24 0436) Resp:  [16-18] 18 (03/24 0436) BP: (96-139)/(73-86) 96/74 (03/24 0436) SpO2:  [95 %-98 %] 95 % (03/24 0436)  Intake/Output from previous day: 03/23 0701 - 03/24 0700 In: 480 [P.O.:480] Out: 1275 [Urine:1275] Intake/Output this shift: No intake/output data recorded.  Physical Exam:  NAD Reading a book - was walking in the halls Abd - soft, NT -- incisions C/D/I  Ext - no CCE    Lab Results:  Recent Labs  03/31/16 1309 04/01/16 0451 04/02/16 0438  HGB 9.7* 9.0* 7.6*  HCT 28.1* 26.2* 21.7*   BMET  Recent Labs  04/01/16 0451 04/02/16 0438  NA 137 135  K 4.4 3.8  CL 104 103  CO2 26 27  GLUCOSE 136* 108*  BUN 15 14  CREATININE 1.59* 1.53*  CALCIUM 8.5* 8.2*   No results for input(s): LABPT, INR in the last 72 hours. No results for input(s): LABURIN in the last 72 hours. Results for orders placed or performed during the hospital encounter of 03/31/16  Urine culture     Status: None   Collection Time: 03/31/16  5:38 AM  Result Value Ref Range Status   Specimen Description URINE, CLEAN CATCH  Final   Special Requests NONE  Final   Culture   Final    NO GROWTH Performed at [facility], [place] [address], [place], [place] [address]    Report Status 04/01/2016 FINAL  Final    Studies/Results: No results found.  Assessment/Plan: POD#2 right Nx - -H/H down - recheck at 1300 - may need transfusion - may be equilibrating or some post-op bleeding given the large mass.  -ambulating in halls - otherwise looks good     LOS: 2 days   Rikki Trosper 04/02/2016, 11:42 AM

## 2016-04-02 NOTE — Progress Notes (Signed)
Pt states that he will self administer CPAP when ready for bed.  Pt to notify RT if any assistance is required throughout the night.  RT to monitor and assess as needed.  

## 2016-04-03 LAB — BASIC METABOLIC PANEL
ANION GAP: 9 (ref 5–15)
BUN: 16 mg/dL (ref 6–20)
CO2: 26 mmol/L (ref 22–32)
Calcium: 8.8 mg/dL — ABNORMAL LOW (ref 8.9–10.3)
Chloride: 100 mmol/L — ABNORMAL LOW (ref 101–111)
Creatinine, Ser: 1.52 mg/dL — ABNORMAL HIGH (ref 0.61–1.24)
GFR calc Af Amer: >60 mL/min (ref >60)
GFR, EST NON AFRICAN AMERICAN: 55 mL/min — AB (ref >60)
GLUCOSE: 105 mg/dL — AB (ref 65–99)
POTASSIUM: 3.9 mmol/L (ref 3.5–5.1)
Sodium: 135 mmol/L (ref 135–145)

## 2016-04-03 LAB — HEMOGLOBIN AND HEMATOCRIT, BLOOD
HEMATOCRIT: 28.3 % — AB (ref 39.0–52.0)
HEMOGLOBIN: 9.7 g/dL — AB (ref 13.0–17.0)

## 2016-04-03 MED ORDER — METOCLOPRAMIDE HCL 5 MG/5ML PO SOLN
10.0000 mg | Freq: Three times a day (TID) | ORAL | Status: DC
  Administered 2016-04-03 – 2016-04-04 (×4): 10 mg via ORAL
  Filled 2016-04-03 (×5): qty 10

## 2016-04-03 MED ORDER — TRAMADOL HCL 50 MG PO TABS
50.0000 mg | ORAL_TABLET | Freq: Four times a day (QID) | ORAL | Status: DC | PRN
  Administered 2016-04-03: 50 mg via ORAL
  Filled 2016-04-03: qty 1

## 2016-04-03 MED ORDER — PROMETHAZINE HCL 25 MG/ML IJ SOLN: 12.5000 mg | Freq: Four times a day (QID) | INTRAMUSCULAR | Status: DC | PRN

## 2016-04-03 NOTE — Progress Notes (Signed)
Pt states that he will self administer CPAP when ready for bed.  RT to monitor and assess as needed.  

## 2016-04-03 NOTE — Progress Notes (Addendum)
3 Days Post-Op   Assessment/Plan: POD#3 Right rad Nx - s/p1 unit PRBC 3/24: -encouraged ambulation - I think he'll feel better after the blood.  -po pain meds  -reglan  -phenergan - -home when ambulating normally - otherwise stable    Subjective: Patient reports dizziness and heartburn. He's getting zofran which helps acid reflux. Also getting dilaudid, both of which can cause dizziness. He's No SOB or CP. He has not ambulated or been out of bed today. H/H stable after blood. BMP looks good. He had a BM.   Objective: Vital signs in last 24 hours: Temp:  [98.1 F (36.7 C)-99.6 F (37.6 C)] 98.1 F (36.7 C) (03/25 0502) Pulse Rate:  [106-122] 111 (03/25 0502) Resp:  [18-20] 18 (03/25 0502) BP: (116-128)/(72-82) 126/77 (03/25 0502) SpO2:  [93 %-96 %] 94 % (03/25 0502)  Intake/Output from previous day: 03/24 0701 - 03/25 0700 In: 1270 [P.O.:960; Blood:310] Out: 800 [Urine:800] Intake/Output this shift: Total I/O In: 600 [P.O.:600] Out: -   Physical Exam:  NAD Alert and Oriented x 3 CV - RRR Lungs - resp rate, effort and depth normal.  Abd - soft, NT Ext - no calf pain or swelling   Lab Results:  Recent Labs  04/02/16 1213 04/02/16 2124 04/03/16 0503  HGB 7.5* 9.4* 9.7*  HCT 21.6* 26.6* 28.3*   BMET  Recent Labs  04/02/16 0438 04/03/16 0503  NA 135 135  K 3.8 3.9  CL 103 100*  CO2 27 26  GLUCOSE 108* 105*  BUN 14 16  CREATININE 1.53* 1.52*  CALCIUM 8.2* 8.8*   No results for input(s): LABPT, INR in the last 72 hours. No results for input(s): LABURIN in the last 72 hours. Results for orders placed or performed during the hospital encounter of 03/31/16  Urine culture     Status: None   Collection Time: 03/31/16  5:38 AM  Result Value Ref Range Status   Specimen Description URINE, CLEAN CATCH  Final   Special Requests NONE  Final   Culture   Final    NO GROWTH Performed at [facility], [place] [address], [place], [place] [address]    Report  Status 04/01/2016 FINAL  Final    Studies/Results: No results found.    LOS: 3 days   Arliene Rosenow 04/03/2016, 11:19 AM

## 2016-04-04 LAB — BASIC METABOLIC PANEL
ANION GAP: 7 (ref 5–15)
BUN: 18 mg/dL (ref 6–20)
CALCIUM: 8.6 mg/dL — AB (ref 8.9–10.3)
CHLORIDE: 100 mmol/L — AB (ref 101–111)
CO2: 27 mmol/L (ref 22–32)
Creatinine, Ser: 1.58 mg/dL — ABNORMAL HIGH (ref 0.61–1.24)
GFR calc non Af Amer: 52 mL/min — ABNORMAL LOW (ref >60)
GLUCOSE: 99 mg/dL (ref 65–99)
Potassium: 3.6 mmol/L (ref 3.5–5.1)
Sodium: 134 mmol/L — ABNORMAL LOW (ref 135–145)

## 2016-04-04 LAB — CBC
HEMATOCRIT: 25.5 % — AB (ref 39.0–52.0)
HEMOGLOBIN: 8.9 g/dL — AB (ref 13.0–17.0)
MCH: 29.8 pg (ref 26.0–34.0)
MCHC: 34.9 g/dL (ref 30.0–36.0)
MCV: 85.3 fL (ref 78.0–100.0)
Platelets: 418 10*3/uL — ABNORMAL HIGH (ref 150–400)
RBC: 2.99 MIL/uL — ABNORMAL LOW (ref 4.22–5.81)
RDW: 13 % (ref 11.5–15.5)
WBC: 10.4 10*3/uL (ref 4.0–10.5)

## 2016-04-04 NOTE — Discharge Instructions (Signed)

## 2016-04-04 NOTE — Care Management Note (Signed)
Case Management Note  Patient Details  Name: Shawn Estrada MRN: [id number] Date of Birth: 1975-01-15  Subjective/Objective:                    Action/Plan:d/c home no needs.   Expected Discharge Date:  04/04/16               Expected Discharge Plan:  Home/Self Care  In-House Referral:     Discharge planning Services  CM Consult  Post Acute Care Choice:    Choice offered to:     DME Arranged:    DME Agency:     HH Arranged:    HH Agency:     Status of Service:  Completed, signed off  If discussed at [employer] of Stay Meetings, dates discussed:    Additional Comments:  Dessa Phi, RN 04/04/2016, 10:03 AM

## 2016-04-05 NOTE — Progress Notes (Signed)
  Progress Note   Date: 04/05/2016  Patient Name: Shawn Estrada        MRN#: [id number]  Review of the patient's clinical findings supports the diagnosis of  Acute blood loss anemia     Ardis Hughs, MD

## 2016-04-06 LAB — TYPE AND SCREEN
ABO/RH(D): A POS
Antibody Screen: NEGATIVE
Unit division: 0
Unit division: 0

## 2016-04-06 LAB — BPAM RBC
BLOOD PRODUCT EXPIRATION DATE: 201804152359
Blood Product Expiration Date: 201804152359
ISSUE DATE / TIME: 201803241629
UNIT TYPE AND RH: 6200
Unit Type and Rh: 6200

## 2016-04-06 NOTE — Code Documentation (Signed)
  FINAL DIAGNOSIS:1. 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

## 2016-04-13 ENCOUNTER — Other Ambulatory Visit (HOSPITAL_COMMUNITY): Payer: Self-pay | Admitting: Psychiatry

## 2016-04-13 DIAGNOSIS — F331 Major depressive disorder, recurrent, moderate: Secondary | ICD-10-CM

## 2016-04-15 ENCOUNTER — Ambulatory Visit (HOSPITAL_COMMUNITY): Payer: Self-pay | Admitting: Psychiatry

## 2016-04-15 DIAGNOSIS — Z8553 Personal history of malignant neoplasm of renal pelvis: Secondary | ICD-10-CM | POA: Diagnosis not present

## 2016-04-18 ENCOUNTER — Other Ambulatory Visit (HOSPITAL_COMMUNITY): Payer: Self-pay

## 2016-04-18 DIAGNOSIS — F331 Major depressive disorder, recurrent, moderate: Secondary | ICD-10-CM

## 2016-04-18 MED ORDER — LAMOTRIGINE 100 MG PO TABS: 100.0000 mg | ORAL_TABLET | Freq: Every day | ORAL | 0 refills | Status: DC

## 2016-04-20 ENCOUNTER — Ambulatory Visit (HOSPITAL_BASED_OUTPATIENT_CLINIC_OR_DEPARTMENT_OTHER): Payer: Commercial Managed Care - HMO | Admitting: Oncology

## 2016-04-20 ENCOUNTER — Telehealth: Payer: Self-pay | Admitting: Oncology

## 2016-04-20 VITALS — BP 123/80 | HR 84 | Temp 98.4°F | Resp 18 | Ht 74.0 in | Wt 232.8 lb

## 2016-04-20 DIAGNOSIS — R918 Other nonspecific abnormal finding of lung field: Secondary | ICD-10-CM

## 2016-04-20 DIAGNOSIS — C641 Malignant neoplasm of right kidney, except renal pelvis: Secondary | ICD-10-CM | POA: Diagnosis not present

## 2016-04-20 DIAGNOSIS — N2889 Other specified disorders of kidney and ureter: Secondary | ICD-10-CM

## 2016-04-20 DIAGNOSIS — Z905 Acquired absence of kidney: Secondary | ICD-10-CM

## 2016-04-20 MED ORDER — CABOZANTINIB S-MALATE 60 MG PO TABS: 60.0000 mg | ORAL_TABLET | Freq: Every day | ORAL | 0 refills | Status: DC

## 2016-04-20 NOTE — Progress Notes (Signed)
Reason for Referral: Kidney cancer.   HPI: 41 year old gentleman currently of [address], [place] Where He Lived the Majority of His Life. He is a rather healthy gentleman with history of depression, GERD and mild hypertension. He was in his usual state of health until he presented to [facility] in March 2017 with symptoms of acute onset of gross hematuria. At that time a CT scan without contrast on 03/26/2016 was obtained to rule out for nephrolithiasis. The CT scan showed a large tumor extending from the right kidney into the right hilum as well as invasion into the renal pelvis. The renal vein appears to be involved as well. Small nodules also noted in the lung base suspicious for metastasis. Based on these findings CT scan of the chest, abdomen and pelvis obtained with contrast on 03/26/2016. The CT scan showed a 12.2 cm exophytic mass of the right kidney with extension into the right renal hilum as well as the perirenal space. 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 Scattered pulmonary nodules on the long highly suspicious for metastatic disease were noted. There are about 17 pulmonary nodules noted. The index nodule of the left upper lobe near the major fissure measuring 1.1 x 0.5 cm.  On 03/31/2016 he underwent a laparoscopic radical nephrectomy performed by Dr. Louis Meckel and tolerated the procedure very well. He was discharged home without complications. 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 The tumor invades the renal vein and the perirenal and renal sinus fat indicating pathological T3a. Margins are negative of tumor.   Clinically, he reports no symptoms at this time. He does report some slight pain around the incision site no hematuria or dysuria. He does report some mild headaches but no blurry vision or syncope. His appetite is reasonable without any nausea or vomiting. He has not resumed  working or driving.  He does not report any headaches, blurry vision, syncope or seizures. He does not report any fevers, chills, sweats or weight loss. He is not reporting any chest pain, palpitation, orthopnea or leg edema. He does not report any cough, wheezing or hemoptysis. He does not report any nausea, vomiting or abdominal pain. He does not report any arthralgias, myalgias. He does not report any skeletal complaints. He does not report any petechiae or excessive bleeding. He does not report any skin rash or ecchymosis. Remaining review of systems unremarkable.   Past Medical History:  Diagnosis Date  . Cancer ([place])    kidney  . Complication of anesthesia    slow to wake up  . Depression   . GERD (gastroesophageal reflux disease)   . Hypertension    No meds prescribed at this time  . PONV (postoperative nausea and vomiting)   . Sleep apnea    cpap  :  Past Surgical History:  Procedure Laterality Date  . CHOLECYSTECTOMY    . LAPAROSCOPIC NEPHRECTOMY Right 03/31/2016   Procedure: RIGHT LAPAROSCOPIC RADICAL NEPHRECTOMY;  Surgeon: Ardis Hughs, MD;  Location: [facility];  Service: Urology;  Laterality: Right;  :   Current Outpatient Prescriptions:  .  ferrous sulfate 325 (65 FE) MG tablet, Take 1 tablet (325 mg total) by mouth 3 (three) times daily with meals., Disp: 90 tablet, Rfl: 0 .  lamoTRIgine (LAMICTAL) 100 MG tablet, Take 1 tablet (100 mg total) by mouth daily., Disp: 30 tablet, Rfl: 0 .  NON FORMULARY, Take 1 capsule by mouth daily. Stool softner,  Disp: , Rfl:  .  omeprazole (PRILOSEC) 20 MG capsule, Take 20 mg by mouth daily., Disp: , Rfl:  .  cabozantinib S-Malate (CABOMETYX) 60 MG TABS, Take 60 mg by mouth daily., Disp: 30 tablet, Rfl: 0:  No Known Allergies:  Family History  Problem Relation Age of Onset  . Depression Sister   :  Social History   Social History  . Marital status: Single    Spouse name: N/A  . Number of children: N/A  . Years of  education: N/A   Occupational History  . Not on file.   Social History Main Topics  . Smoking status: Current Some Day Smoker    Packs/day: 0.25    Types: Cigarettes    Start date: 10/23/1989  . Smokeless tobacco: Current User    Types: Snuff     Comment: States only smokes when he drinks occasionally   . Alcohol use 0.0 - 4.2 oz/week     Comment: Occasional use  . Drug use: No  . Sexual activity: Yes    Partners: Female    Birth control/ protection: None, Condom   Other Topics Concern  . Not on file   Social History Narrative  . No narrative on file  :  Pertinent items are noted in HPI.  Exam: Blood pressure 123/80, pulse 84, temperature 98.4 F (36.9 C), temperature source Oral, resp. rate 18, height 6\' 2"  (1.88 m), weight 232 lb 12.8 oz (105.6 kg), SpO2 100 %.  ECOG 0.  General appearance: alert and cooperative appeared without distress. Head: Normocephalic, without obvious abnormality Throat: lips, mucosa, and tongue normal; teeth and gums normal Neck: no adenopathy Back: negative Resp: clear to auscultation bilaterally Chest wall: no tenderness Cardio: regular rate and rhythm, S1, S2 normal, no murmur, click, rub or gallop GI: soft, non-tender; bowel sounds normal; no masses,  no organomegaly Extremities: extremities normal, atraumatic, no cyanosis or edema Pulses: 2+ and symmetric  CBC    Component Value Date/Time   WBC 10.4 04/04/2016 0504   RBC 2.99 (L) 04/04/2016 0504   HGB 8.9 (L) 04/04/2016 0504   HGB 13.4 12/26/2011 1151   HCT 25.5 (L) 04/04/2016 0504   HCT 42.6 03/04/2016 1123   PLT 418 (H) 04/04/2016 0504   PLT 279 03/04/2016 1123   MCV 85.3 04/04/2016 0504   MCV 87 03/04/2016 1123   MCV 89 12/26/2011 1151   MCH 29.8 04/04/2016 0504   MCHC 34.9 04/04/2016 0504   RDW 13.0 04/04/2016 0504   RDW 13.0 03/04/2016 1123   RDW 12.8 12/26/2011 1151   LYMPHSABS 1.9 03/26/2016 0906   LYMPHSABS 2.0 03/04/2016 1123   LYMPHSABS 1.7 12/26/2011 1151    MONOABS 0.7 03/26/2016 0906   MONOABS 0.6 12/26/2011 1151   EOSABS 0.2 03/26/2016 0906   EOSABS 0.2 03/04/2016 1123   EOSABS 0.3 12/26/2011 1151   BASOSABS 0.1 03/26/2016 0906   BASOSABS 0.1 03/04/2016 1123   BASOSABS 0.1 12/26/2011 1151     Chemistry      Component Value Date/Time   NA 134 (L) 04/04/2016 0504   NA 142 03/04/2016 1123   NA 136 12/15/2011 0601   K 3.6 04/04/2016 0504   K 4.0 12/15/2011 0601   CL 100 (L) 04/04/2016 0504   CL 105 12/15/2011 0601   CO2 27 04/04/2016 0504   CO2 24 12/15/2011 0601   BUN 18 04/04/2016 0504   BUN 11 03/04/2016 1123   BUN 10 12/15/2011 0601   CREATININE 1.58 (H)  04/04/2016 0504   CREATININE 0.88 12/15/2011 0601      Component Value Date/Time   CALCIUM 8.6 (L) 04/04/2016 0504   CALCIUM 8.7 12/15/2011 0601   ALKPHOS 56 03/31/2016 1309   ALKPHOS 65 12/14/2011 0212   AST 39 03/31/2016 1309   AST 24 12/14/2011 0212   ALT 38 03/31/2016 1309   ALT 35 12/14/2011 0212   BILITOT 1.1 03/31/2016 1309   BILITOT 0.4 03/04/2016 1123   BILITOT 0.5 12/14/2011 0212       Ct Abdomen Pelvis W Wo Contrast  Result Date: 03/26/2016 CLINICAL DATA:  Right renal mass. Right-sided flank pain and hematuria. EXAM: CT CHEST, ABDOMEN, AND PELVIS WITH CONTRAST TECHNIQUE: Multidetector CT imaging of the chest, abdomen and pelvis was performed following the standard protocol during bolus administration of intravenous contrast. CONTRAST:  170mL ISOVUE-300 IOPAMIDOL (ISOVUE-300) INJECTION 61% COMPARISON:  03/26/2016 FINDINGS: CT CHEST FINDINGS Cardiovascular: Unremarkable Mediastinum/Nodes: Right paratracheal node 0.7 cm in short axis on image 29/7. Other small mediastinal lymph nodes are also observed. Lungs/Pleura: There are approximately 17 scattered pulmonary nodules. Most of these are small. An index nodule in the left upper lobe near the major fissure measures 1.1 by 0.5 cm on image 30/8. No cavitation. Musculoskeletal: Unremarkable CT ABDOMEN PELVIS FINDINGS  Hepatobiliary: Cholecystectomy.  No hepatic mass identified. Pancreas: Unremarkable Spleen: Unremarkable Adrenals/Urinary Tract: Adrenal glands normal. 11.4 by 12.2 by 8.7 cm centrally necrotic exophytic mass from the right kidney lower pole with lobulations of tumor extending up into the left mid kidney, the renal hilum, and in the perirenal space. Tumor vascularity in the perirenal space. The lower pole mass extends to the fascia margin of the right psoas muscle, and micro invasion of the psoas is not readily excluded. Indistinct right proximal ureter, with mild right hydronephrosis and a lead excretion of the right kidney possibly from extrinsic compression or invasion of the right proximal ureter. No left renal mass. In the urinary bladder there are filling defects measuring approximately 3.1 by 1.9 cm for example on image 204/7. Comparing the postcontrast density on today' s exam with the precontrast density from 03/26/2016, I favor this as representing blood clots rather than enhancing tumor. Stomach/Bowel: Unremarkable Vascular/Lymphatic: Tumor thrombus in the right renal vein as on image 33/2. The medial component of tumor from the right kidney on image 50/2 appears to somewhat displace the IVC anteriorly and to the left. AP window lymph node 0.9 cm in short axis image 51/2. Accessory right renal vein draining the neovascularity from the right inferior perirenal space. A right external iliac node measures 0.5 cm in short axis on image 188/7. Reproductive: Unremarkable Other: Perirenal stranding on the right. Musculoskeletal: Schmorl's node along the superior endplate of L5. Bridging spurring of the right sacroiliac joint. IMPRESSION: 1. 12.2 cm centrally necrotic exophytic mass of the right kidney lower pole with considerable lobulations of tumor extending into the right renal hilum and perirenal space. Associated tumor thrombus in the right renal vein. Upper normal sized AP window lymph node at this level.  Delayed excretion from the right kidney with mild upper pole hydronephrosis possibly due to extrinsic compression or invasion of the right proximal ureter. 2. Scattered small pulmonary nodules in the lungs, high suspicion for metastatic disease in this context. 3. Filling defect in the deep ended part of the urinary bladder does not appear to be enhancing and probably represents blood clots. Electronically Signed   By: Van Clines M.D.   On: 03/26/2016 13:38   Ct  Chest W Contrast  Result Date: 03/26/2016 CLINICAL DATA:  Right renal mass. Right-sided flank pain and hematuria. EXAM: CT CHEST, ABDOMEN, AND PELVIS WITH CONTRAST TECHNIQUE: Multidetector CT imaging of the chest, abdomen and pelvis was performed following the standard protocol during bolus administration of intravenous contrast. CONTRAST:  11mL ISOVUE-300 IOPAMIDOL (ISOVUE-300) INJECTION 61% COMPARISON:  03/26/2016 FINDINGS: CT CHEST FINDINGS Cardiovascular: Unremarkable Mediastinum/Nodes: Right paratracheal node 0.7 cm in short axis on image 29/7. Other small mediastinal lymph nodes are also observed. Lungs/Pleura: There are approximately 17 scattered pulmonary nodules. Most of these are small. An index nodule in the left upper lobe near the major fissure measures 1.1 by 0.5 cm on image 30/8. No cavitation. Musculoskeletal: Unremarkable CT ABDOMEN PELVIS FINDINGS Hepatobiliary: Cholecystectomy.  No hepatic mass identified. Pancreas: Unremarkable Spleen: Unremarkable Adrenals/Urinary Tract: Adrenal glands normal. 11.4 by 12.2 by 8.7 cm centrally necrotic exophytic mass from the right kidney lower pole with lobulations of tumor extending up into the left mid kidney, the renal hilum, and in the perirenal space. Tumor vascularity in the perirenal space. The lower pole mass extends to the fascia margin of the right psoas muscle, and micro invasion of the psoas is not readily excluded. Indistinct right proximal ureter, with mild right hydronephrosis  and a lead excretion of the right kidney possibly from extrinsic compression or invasion of the right proximal ureter. No left renal mass. In the urinary bladder there are filling defects measuring approximately 3.1 by 1.9 cm for example on image 204/7. Comparing the postcontrast density on today' s exam with the precontrast density from 03/26/2016, I favor this as representing blood clots rather than enhancing tumor. Stomach/Bowel: Unremarkable Vascular/Lymphatic: Tumor thrombus in the right renal vein as on image 33/2. The medial component of tumor from the right kidney on image 50/2 appears to somewhat displace the IVC anteriorly and to the left. AP window lymph node 0.9 cm in short axis image 51/2. Accessory right renal vein draining the neovascularity from the right inferior perirenal space. A right external iliac node measures 0.5 cm in short axis on image 188/7. Reproductive: Unremarkable Other: Perirenal stranding on the right. Musculoskeletal: Schmorl's node along the superior endplate of L5. Bridging spurring of the right sacroiliac joint. IMPRESSION: 1. 12.2 cm centrally necrotic exophytic mass of the right kidney lower pole with considerable lobulations of tumor extending into the right renal hilum and perirenal space. Associated tumor thrombus in the right renal vein. Upper normal sized AP window lymph node at this level. Delayed excretion from the right kidney with mild upper pole hydronephrosis possibly due to extrinsic compression or invasion of the right proximal ureter. 2. Scattered small pulmonary nodules in the lungs, high suspicion for metastatic disease in this context. 3. Filling defect in the deep ended part of the urinary bladder does not appear to be enhancing and probably represents blood clots. Electronically Signed   By: Van Clines M.D.   On: 03/26/2016 13:38   Ct Renal Stone Study  Result Date: 03/26/2016 CLINICAL DATA:  Right-sided flank pain. EXAM: CT ABDOMEN AND PELVIS  WITHOUT CONTRAST TECHNIQUE: Multidetector CT imaging of the abdomen and pelvis was performed following the standard protocol without IV contrast. COMPARISON:  November 30, 2009 FINDINGS: Lower chest: Multiple nodules are seen in the bilateral lung bases, new in the interval. A representative nodule in the left base on series 4, image well measures 6.6 mm. Atelectasis in the lingula. Lung bases are otherwise unremarkable. Hepatobiliary: The patient is status post cholecystectomy. The liver  may identified. Pancreas: Unremarkable. No pancreatic ductal dilatation or surrounding inflammatory changes. Spleen: Normal in size without focal abnormality. Adrenals/Urinary Tract: The left kidney is normal in appearance with no stones, hydronephrosis, perinephric stranding, or ureterectasis. There is a large suspicious mass associated with the lower pole of the right kidney best appreciated on coronal image 79 measuring 12 x 7.5 cm. I suspect extension into the right renal pelvis as seen on coronal image 79. The apparent extension into the right renal hilum abuts and deforms the right renal pelvis as seen on axial image 37. Extension into the right renal pelvis is not excluded. There is mild right-sided hydronephrosis. There is increased vascularity around the right kidney and adjacent fat stranding. The right ureter is nondilated. There is nodular high attenuation posteriorly in the bladder on series 2, image 87 measuring up to 2.9 cm. The bladder is otherwise unremarkable Stomach/Bowel: There is a small hiatal hernia. The stomach is otherwise normal. The stomach and small bowel are normal. Scattered colonic diverticulosis is seen without diverticulitis. The colon is otherwise normal. The appendix is not seen but there is no secondary evidence of appendicitis. Vascular/Lymphatic: No definitive adenopathy identified. The right renal vein is distended compared to the left. No aortic aneurysm or significant atherosclerotic.  Reproductive: Prostate is unremarkable. Other: No abdominal wall hernia or abnormality. No abdominopelvic ascites. Musculoskeletal: No acute or significant osseous findings. IMPRESSION: 1. The right kidney is abnormal, highly suspicious for a large renal cell carcinoma. The suspected tumor extends into the right renal hilum exerting mass effect on the right renal pelvis. Invasion of the right renal pelvis is not excluded. The right renal vein is larger than the left and tumor extension into the right renal vein is not excluded. There is resulting right-sided mild hydronephrosis. There is increased vascularity around the right kidney. An MRI could better assess the suspected right-sided malignant/neoplastic process. 2. Nodular high attenuation in the bladder may represent blood products. Given the attenuation, I suspect tumor in the bladder is less likely. However, this region should be assessed further with direct visualization. 3. Multiple small nodules in the lung bases are suspicious for early metastatic disease. Electronically Signed   By: Dorise Bullion III M.D   On: 03/26/2016 10:01    Assessment and Plan:   41 year old gentleman with the following issues:  1. Renal cell carcinoma diagnosed in March 2018. He presented with a right kidney mass measuring close to 12 cm x 7.5 cm arising of the lower pole of the right kidney. He is status post laparoscopic right radical nephrectomy completed on 03/31/2016 by Dr. Louis Meckel. 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 No sarcomatoid features noted and no positive margins. The final tumor size was 11 cm. A CT scan did show bilateral pulmonary nodules highly suspicious for malignancy.  The natural course of this disease was discussed today with the patient and his family. He appears to have stage IV disease with metastatic pulmonary nodules. These pulmonary nodules could be of other etiology such as benign causes but this is  considered to be extremely less likely. From a management standpoint I have recommended complete his staging workup including MRI of the brain as well as a PET CT scan for further characterization of these lung nodules.  Options of therapy were reviewed today. These options would include work-related targeted agents such as Cabometyx, immunotherapy in the form of Pembrolizumab, high dose IL-2, or combination of immunotherapy which includes a (Ipi +  Nivo). We also discussed the possibility of combining Cabometyx with Nivolumab which also showed some promising findings. The risks and benefits of single agent therapy versus combination of these lesions were debated today. He appears to have high risk disease but low volume noted on these imaging studies. He is a rather healthy gentleman who is medically fit which could also be considered for high-dose IL-2.  After discussion today, the plan is to complete the staging workup and consider starting, Cabometyx in the near future. Complications associated with this medication were reviewed today. These include fatigue, tiredness, diarrhea, hand-foot syndrome and mucositis. Weight loss and increase in transaminases as well as has been reported. If his PET scan showed high volume disease, we might consider combining this medication with immunotherapy for better response rate.  2. CNS metastatic screening: He will have MRI of the brain to rule that out. If he does have CNS metastasis, he will likely require radiation therapy.  3. Diarrhea prophylaxis: Instructions to use Imodium and prescription for Lomotil will be made available if needed to.  4. Prognosis: This was discussed today in length with the patient and his family. He appears to have a disease that is incurable. However, his disease can be controlled and palliated for an extended period of time depending on his response to therapy.  All his questions were answered today to their satisfaction.

## 2016-04-20 NOTE — Telephone Encounter (Signed)
Appointments scheduled per 04/20/16 los. Patient was given a copy of the AVS report and appointment schedule, per 04/20/16 los. °

## 2016-04-20 NOTE — Progress Notes (Signed)
START OFF PATHWAY REGIMEN - Renal Cell   [id number]:Cabozantinib (Cabometyx(TM)) 60 mg Daily - RCC:   Daily:     Cabozantinib (tablet)   **Always confirm dose/schedule in your pharmacy ordering system**    Patient Characteristics: Metastatic, Clear Cell, First Line AJCC M Category: Staged < 8th Ed. AJCC 8 Stage Grouping: IV Current evidence of distant metastases? Yes AJCC T Category: [place] AJCC N Category: NX Does patient have oligometastatic disease? No Would you be surprised if this patient died  in the next year? I would be surprised if this patient died in the next year Histology: Clear Cell Line of therapy: First Line  Intent of Therapy: Non-Curative / Palliative Intent, Discussed with Patient

## 2016-04-20 NOTE — Progress Notes (Signed)
Gave Scientist, clinical (histocompatibility and immunogenetics) and medication packet from cabometyx to patient and family. Answered all questions, gave my name and # along with stacey camp's name. Script for cabometyx given to [employer] oral chemo navigator. Patient filled out form for 15 day free trial to be sent to the [facility].

## 2016-04-29 ENCOUNTER — Ambulatory Visit (HOSPITAL_COMMUNITY)
Admission: RE | Admit: 2016-04-29 | Discharge: 2016-04-29 | Disposition: A | Discharge status: Home / Self Care | Payer: Commercial Managed Care - HMO | Source: Ambulatory Visit | Attending: Oncology | Admitting: Oncology

## 2016-04-29 ENCOUNTER — Other Ambulatory Visit: Payer: Self-pay | Admitting: Oncology

## 2016-04-29 DIAGNOSIS — Z01818 Encounter for other preprocedural examination: Secondary | ICD-10-CM | POA: Diagnosis not present

## 2016-04-29 DIAGNOSIS — N2889 Other specified disorders of kidney and ureter: Secondary | ICD-10-CM | POA: Diagnosis present

## 2016-04-29 DIAGNOSIS — N289 Disorder of kidney and ureter, unspecified: Secondary | ICD-10-CM | POA: Diagnosis not present

## 2016-04-29 DIAGNOSIS — R918 Other nonspecific abnormal finding of lung field: Secondary | ICD-10-CM

## 2016-04-29 DIAGNOSIS — Z905 Acquired absence of kidney: Secondary | ICD-10-CM | POA: Insufficient documentation

## 2016-04-29 LAB — GLUCOSE, CAPILLARY: Glucose-Capillary: 94 mg/dL (ref 65–99)

## 2016-04-29 MED ORDER — GADOBENATE DIMEGLUMINE 529 MG/ML IV SOLN
20.0000 mL | Freq: Once | INTRAVENOUS | Status: AC | PRN
  Administered 2016-04-29: 20 mL via INTRAVENOUS

## 2016-04-29 MED ORDER — FLUDEOXYGLUCOSE F - 18 (FDG) INJECTION
11.5800 | Freq: Once | INTRAVENOUS | Status: AC
  Administered 2016-04-29: 11.58 via INTRAVENOUS

## 2016-05-02 ENCOUNTER — Encounter: Payer: Self-pay | Admitting: *Deleted

## 2016-05-02 ENCOUNTER — Telehealth: Payer: Self-pay | Admitting: Pharmacist

## 2016-05-02 ENCOUNTER — Telehealth: Payer: Self-pay | Admitting: *Deleted

## 2016-05-02 DIAGNOSIS — C641 Malignant neoplasm of right kidney, except renal pelvis: Secondary | ICD-10-CM

## 2016-05-02 MED ORDER — CABOZANTINIB S-MALATE 60 MG PO TABS: 60.0000 mg | ORAL_TABLET | Freq: Every day | ORAL | 0 refills | Status: DC

## 2016-05-02 NOTE — Telephone Encounter (Signed)
Called [facility], [phone]. Spoke with cindy, new patient coordinator. She was unable to pull op patient's chart in epic. She instructed me to call her medical records dept to help with this, medical records personnel said this is an IT issue. Faxed chart to [facility] # [phone].

## 2016-05-02 NOTE — Telephone Encounter (Signed)
Faxed pathology to [facility], she will request slides. Requested vanda in radiology to have all scans burned to a CD and fed-exed to [facility], [address], [address], [place], [id number]. Attn: Jinny Blossom

## 2016-05-02 NOTE — Telephone Encounter (Addendum)
Oral Chemotherapy Pharmacist Encounter  Received new prescription for Cabometyx for clear cell renal cell carcinoma Labs reviewed, noted elevated SCr, OK for treatment Urinalysis performed 3/17 with too many RBCs to assess protein, urine protein will be added to labs at next MD appt BPs in Epic reviewed and will continue to be monitored Current medication list in Epic assessed, no DDIs with Cabometyx identified  Prescription will be e-scribed to [facility] in [place], [place] (ph: [phone]) for benefits analysis  Oral Oncology Clinic will continue to follow  Johny Drilling, PharmD, BCPS, BCOP 05/02/2016  9:19 AM Oral Oncology Clinic [phone]

## 2016-05-06 DIAGNOSIS — C641 Malignant neoplasm of right kidney, except renal pelvis: Secondary | ICD-10-CM | POA: Diagnosis not present

## 2016-05-09 ENCOUNTER — Ambulatory Visit (INDEPENDENT_AMBULATORY_CARE_PROVIDER_SITE_OTHER): Payer: Commercial Managed Care - HMO | Admitting: Psychiatry

## 2016-05-09 ENCOUNTER — Encounter (HOSPITAL_COMMUNITY): Payer: Self-pay | Admitting: Psychiatry

## 2016-05-09 DIAGNOSIS — Z79899 Other long term (current) drug therapy: Secondary | ICD-10-CM | POA: Diagnosis not present

## 2016-05-09 DIAGNOSIS — Z818 Family history of other mental and behavioral disorders: Secondary | ICD-10-CM | POA: Diagnosis not present

## 2016-05-09 DIAGNOSIS — F331 Major depressive disorder, recurrent, moderate: Secondary | ICD-10-CM

## 2016-05-09 DIAGNOSIS — F17210 Nicotine dependence, cigarettes, uncomplicated: Secondary | ICD-10-CM | POA: Diagnosis not present

## 2016-05-09 MED ORDER — LAMOTRIGINE 100 MG PO TABS: 100.0000 mg | ORAL_TABLET | Freq: Every day | ORAL | 0 refills | Status: DC

## 2016-05-09 NOTE — Telephone Encounter (Signed)
Oral Chemotherapy Pharmacist Encounter  Patient has transferred care to [facility]. Cabometyx prescription cancelled at [facility]. No further needs from [facility] identified at this time. Oral Oncology Clinic will sign off. Please let us know if we can be of assistance in the future.   Johny Drilling, PharmD, BCPS, BCOP 05/09/2016  3:14 PM Oral Oncology Clinic [phone]

## 2016-05-09 NOTE — Progress Notes (Signed)
[facility] MD/PA/NP OP Progress Note  05/09/2016 11:47 AM Shawn Estrada  MRN:  [id number]  Chief Complaint:  Chief Complaint    Follow-up     Subjective:  I'm doing better.  I have surgery for cancer in my kidney.  HPI: Shawn Estrada came for his follow-up appointment.  Last month he had a surgery for renal cell carcinoma.  He is doing much better.  He is more taking any pain medication.  He sleeping good.  He is taking Lamictal which is helping his mood irritability and anger.  He has no rash or itching.  He is surprised that he handled the news and surgery very well which he believes by taking the medication.  He had a good support from his girlfriend.  He has not back to work yet until May 7.  He is getting second opinion for his kidney by Dr. Jeral Fruit in [place].  Patient really liked Lamictal because it is helping him calm and he denies any recent agitation, anger, frustration or depression.  His energy level is getting better.  He denies any active or passive suicidal thoughts or homicidal thought.  He is more hopeful.  His relationship with his girlfriend is getting better who is also very supportive of the surgery.  Patient does not want to change his medication.  He has no rash, itching, tremors or any shakes.  His vital signs are stable.  He cut down his his drinking from the past.  He denies any illegal substance use.  Visit Diagnosis:    ICD-9-CM ICD-10-CM   1. Moderate episode of recurrent major depressive disorder (HCC) 296.32 F33.1 lamoTRIgine (LAMICTAL) 100 MG tablet    Past Psychiatric History:  Patient denies any history of psychiatric inpatient treatment or any suicidal attempt.  He reported history of anger issues and depression most of his life.  He has seen psychiatrist in [place] in [place].  In the past he has taken Wellbutrin but it did not help.  He has seen therapist on and off since he was in teens.  Patient denies any history of mania, psychosis, hallucination or any OCD  symptoms.    Past Medical History:  Past Medical History:  Diagnosis Date  . Cancer ([place])    kidney  . Complication of anesthesia    slow to wake up  . Depression   . GERD (gastroesophageal reflux disease)   . Hypertension    No meds prescribed at this time  . PONV (postoperative nausea and vomiting)   . Sleep apnea    cpap    Past Surgical History:  Procedure Laterality Date  . CHOLECYSTECTOMY    . LAPAROSCOPIC NEPHRECTOMY Right 03/31/2016   Procedure: RIGHT LAPAROSCOPIC RADICAL NEPHRECTOMY;  Surgeon: Ardis Hughs, MD;  Location: [facility];  Service: Urology;  Laterality: Right;    Family Psychiatric History: Reviewed.  Family History:  Family History  Problem Relation Age of Onset  . Depression Sister     Social History:  Social History   Social History  . Marital status: Single    Spouse name: N/A  . Number of children: N/A  . Years of education: N/A   Social History Main Topics  . Smoking status: Current Some Day Smoker    Packs/day: 0.25    Years: 26.00    Types: Cigarettes    Start date: 10/23/1989  . Smokeless tobacco: Current User    Types: Snuff     Comment: smokes about 10 cigarettes every 2 weeks.   Marland Kitchen  Alcohol use No     Comment: Occasional use  . Drug use: No  . Sexual activity: Yes    Partners: Female    Birth control/ protection: None, Condom   Other Topics Concern  . None   Social History Narrative  . None    Allergies: No Known Allergies  Metabolic Disorder Labs: Lab Results  Component Value Date   HGBA1C 5.2 03/04/2016   No results found for: PROLACTIN No results found for: CHOL, TRIG, HDL, CHOLHDL, VLDL, LDLCALC   Current Medications: Current Outpatient Prescriptions  Medication Sig Dispense Refill  . ferrous sulfate 325 (65 FE) MG tablet Take 1 tablet (325 mg total) by mouth 3 (three) times daily with meals. 90 tablet 0  . lamoTRIgine (LAMICTAL) 100 MG tablet Take 1 tablet (100 mg total) by mouth daily. 90 tablet 0  .  omeprazole (PRILOSEC) 20 MG capsule Take 20 mg by mouth daily.    . cabozantinib S-Malate (CABOMETYX) 60 MG TABS Take 60 mg by mouth daily. (Patient not taking: Reported on 05/09/2016) 30 tablet 0  . NON FORMULARY Take 1 capsule by mouth daily. Stool softner    . traMADol (ULTRAM) 50 MG tablet      No current facility-administered medications for this visit.     Neurologic: Headache: No Seizure: No Paresthesias: No  Musculoskeletal: Strength & Muscle Tone: within normal limits Gait & Station: normal Patient leans: N/A  Psychiatric Specialty Exam: ROS  Blood pressure 108/78, pulse 86, height 6\' 2"  (1.88 m), weight 239 lb (108.4 kg).Body mass index is 30.69 kg/m.  General Appearance: Casual  Eye Contact:  Good  Speech:  Clear and Coherent  Volume:  Normal  Mood:  Euthymic  Affect:  Congruent  Thought Process:  Goal Directed  Orientation:  Full (Time, Place, and Person)  Thought Content: WDL and Logical   Suicidal Thoughts:  No  Homicidal Thoughts:  No  Memory:  Immediate;   Good Recent;   Good Remote;   Good  Judgement:  Good  Insight:  Good  Psychomotor Activity:  Normal  Concentration:  Concentration: Good and Attention Span: Good  Recall:  Good  Fund of Knowledge: Good  Language: Good  Akathisia:  No  Handed:  Right  AIMS (if indicated):  0  Assets:  Communication Skills Desire for Improvement Housing Physical Health Resilience Social Support  ADL's:  Intact  Cognition: WNL  Sleep:  Adequate    Assessment: Major depressive disorder, recurrent.  Plan: I review his records.  Patient recently had surgery for his renal cell carcinoma.  He is getting much better.  He is taking Lamictal 100 mg daily.  I review his blood work results.  His creatinine is 1.58.  Discussed medication side effects and benefits.  Recommended to call us back if he has any question, concern or if he feel worsening of the symptoms.  Follow-up in 3 months.  Discuss safety plan that anytime  having active suicidal thoughts or homicidal thoughts and he need to call 911 or go to local emergency room.  Elivia Robotham T., MD 05/09/2016, 11:47 AM

## 2016-05-12 DIAGNOSIS — N289 Disorder of kidney and ureter, unspecified: Secondary | ICD-10-CM | POA: Diagnosis not present

## 2016-05-12 DIAGNOSIS — C641 Malignant neoplasm of right kidney, except renal pelvis: Secondary | ICD-10-CM | POA: Diagnosis not present

## 2016-05-16 DIAGNOSIS — C641 Malignant neoplasm of right kidney, except renal pelvis: Secondary | ICD-10-CM | POA: Diagnosis not present

## 2016-05-20 ENCOUNTER — Encounter: Payer: Self-pay | Admitting: *Deleted

## 2016-05-27 ENCOUNTER — Ambulatory Visit (HOSPITAL_BASED_OUTPATIENT_CLINIC_OR_DEPARTMENT_OTHER): Payer: Commercial Managed Care - HMO | Admitting: Oncology

## 2016-05-27 ENCOUNTER — Other Ambulatory Visit (HOSPITAL_BASED_OUTPATIENT_CLINIC_OR_DEPARTMENT_OTHER): Payer: Commercial Managed Care - HMO

## 2016-05-27 ENCOUNTER — Telehealth: Payer: Self-pay | Admitting: Oncology

## 2016-05-27 VITALS — BP 125/90 | HR 93 | Temp 98.4°F | Resp 18 | Ht 74.0 in | Wt 251.8 lb

## 2016-05-27 DIAGNOSIS — R918 Other nonspecific abnormal finding of lung field: Secondary | ICD-10-CM | POA: Diagnosis not present

## 2016-05-27 DIAGNOSIS — C642 Malignant neoplasm of left kidney, except renal pelvis: Secondary | ICD-10-CM

## 2016-05-27 DIAGNOSIS — C649 Malignant neoplasm of unspecified kidney, except renal pelvis: Secondary | ICD-10-CM

## 2016-05-27 DIAGNOSIS — N2889 Other specified disorders of kidney and ureter: Secondary | ICD-10-CM

## 2016-05-27 DIAGNOSIS — C641 Malignant neoplasm of right kidney, except renal pelvis: Secondary | ICD-10-CM

## 2016-05-27 LAB — CBC WITH DIFFERENTIAL/PLATELET
BASO%: 0.9 % (ref 0.0–2.0)
Basophils Absolute: 0.1 10*3/uL (ref 0.0–0.1)
EOS ABS: 0.2 10*3/uL (ref 0.0–0.5)
EOS%: 3.3 % (ref 0.0–7.0)
HEMATOCRIT: 39.8 % (ref 38.4–49.9)
HGB: 13.3 g/dL (ref 13.0–17.1)
LYMPH#: 2 10*3/uL (ref 0.9–3.3)
LYMPH%: 34.3 % (ref 14.0–49.0)
MCH: 29.4 pg (ref 27.2–33.4)
MCHC: 33.4 g/dL (ref 32.0–36.0)
MCV: 88.1 fL (ref 79.3–98.0)
MONO#: 0.4 10*3/uL (ref 0.1–0.9)
MONO%: 7.4 % (ref 0.0–14.0)
NEUT%: 54.1 % (ref 39.0–75.0)
NEUTROS ABS: 3.1 10*3/uL (ref 1.5–6.5)
PLATELETS: 243 10*3/uL (ref 140–400)
RBC: 4.52 10*6/uL (ref 4.20–5.82)
RDW: 14 % (ref 11.0–14.6)
WBC: 5.8 10*3/uL (ref 4.0–10.3)

## 2016-05-27 LAB — COMPREHENSIVE METABOLIC PANEL
ALK PHOS: 64 U/L (ref 40–150)
ALT: 23 U/L (ref 0–55)
ANION GAP: 9 meq/L (ref 3–11)
AST: 17 U/L (ref 5–34)
Albumin: 4.3 g/dL (ref 3.5–5.0)
BILIRUBIN TOTAL: 0.42 mg/dL (ref 0.20–1.20)
BUN: 14.2 mg/dL (ref 7.0–26.0)
CALCIUM: 9.3 mg/dL (ref 8.4–10.4)
CO2: 26 mEq/L (ref 22–29)
CREATININE: 1.5 mg/dL — AB (ref 0.7–1.3)
Chloride: 104 mEq/L (ref 98–109)
EGFR: 55 mL/min/{1.73_m2} — AB (ref >90)
Glucose: 101 mg/dl (ref 70–140)
Potassium: 4.1 mEq/L (ref 3.5–5.1)
Sodium: 138 mEq/L (ref 136–145)
TOTAL PROTEIN: 7.9 g/dL (ref 6.4–8.3)

## 2016-05-27 LAB — UA PROTEIN, DIPSTICK - CHCC: PROTEIN: NEGATIVE mg/dL

## 2016-05-27 NOTE — Telephone Encounter (Signed)
Gave patient AVS and calender per 5/18 los. [facility] to contact patient with Ct schedule.

## 2016-05-27 NOTE — Progress Notes (Signed)
Hematology and Oncology Follow Up Visit  Shawn Estrada [id number] 13-May-1975 41 y.o. 05/27/2016 3:06 PM Estrada, Shawn Kirks, PA-CRedmon, Noelle, PA-C   Principle Diagnosis: 41 year old gentleman with renal cell carcinoma diagnosed in March 2018. He presented with a large kidney mass measuring 12 x 7.5 cm. He has pulmonary nodules suspicious for metastatic disease.   Prior Therapy: He is status post laparoscopic right radical nephrectomy completed on 03/31/2016 by Dr. Louis Estrada. 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 No sarcomatoid features noted and no positive margins. The final tumor size was 11 cm. A CT scan did show bilateral pulmonary nodules highly suspicious for malignancy.  Current therapy: Observation and surveillance and under consideration for possible systemic therapy.  Interim History: Mr. Shawn Estrada presents today for a follow-up visit. Since the last visit, he completed his staging workup including a PET/CT scan and MRI of the brain. His MRI did not try any evidence of CNS metastasis and his PET scan was discussed with him at that time. He does have ear more prominent nodule that is not PET avid. Given these findings and any referred him to evaluation with a Dr. Reesa [facility]strada in the [facility]. He was seen by one of Dr. Reesa Estrada partners and his case was discussed in the multidisciplinary tumor board there. He was felt that he likely has metastatic disease although this cannot be confirmed at this time and surveillance scans may be reasonable at this time.  Clinically, he reports feeling reasonably well and have a presumed work-related duties. He is able to work full time despite week and has been reasonably doing well. His stamina is not as good as previously but is improving this week.  He does not report any headaches, blurry vision, syncope or seizures. He does not report any fevers, chills, sweats or weight loss. He is not reporting any chest pain,  palpitation, orthopnea or leg edema. He does not report any cough, wheezing or hemoptysis. He does not report any nausea, vomiting or abdominal pain. He does not report any arthralgias, myalgias. He does not report any skeletal complaints. He does not report any petechiae or excessive bleeding. He does not report any skin rash or ecchymosis. Remaining review of systems unremarkable.   Medications: I have reviewed the patient's current medications.  Current Outpatient Prescriptions  Medication Sig Dispense Refill  . lamoTRIgine (LAMICTAL) 100 MG tablet Take 1 tablet (100 mg total) by mouth daily. 90 tablet 0  . omeprazole (PRILOSEC) 20 MG capsule Take 20 mg by mouth daily.     No current facility-administered medications for this visit.      Allergies: No Known Allergies  Past Medical History, Surgical history, Social history, and Family History were reviewed and updated.   Physical Exam: Blood pressure 125/90, pulse 93, temperature 98.4 F (36.9 C), temperature source Oral, resp. rate 18, height 6\' 2"  (1.88 m), weight 251 lb 12.8 oz (114.2 kg), SpO2 99 %. ECOG: 0 General appearance: alert and cooperative. Without distress. Head: Normocephalic, without obvious abnormality no oral ulcers or lesions. Neck: no adenopathy Lymph nodes: Cervical, supraclavicular, and axillary nodes normal. Heart:regular rate and rhythm, S1, S2 normal, no murmur, click, rub or gallop Lung:chest clear, no wheezing, rales, normal symmetric air entry. Abdomin: soft, non-tender, without masses or organomegaly no rebound or guarding. EXT:no erythema, induration, or nodules   Lab Results: Lab Results  Component Value Date   WBC 5.8 05/27/2016   HGB 13.3 05/27/2016   HCT 39.8 05/27/2016  MCV 88.1 05/27/2016   PLT 243 05/27/2016     Chemistry      Component Value Date/Time   NA 134 (L) 04/04/2016 0504   NA 142 03/04/2016 1123   NA 136 12/15/2011 0601   K 3.6 04/04/2016 0504   K 4.0 12/15/2011 0601    CL 100 (L) 04/04/2016 0504   CL 105 12/15/2011 0601   CO2 27 04/04/2016 0504   CO2 24 12/15/2011 0601   BUN 18 04/04/2016 0504   BUN 11 03/04/2016 1123   BUN 10 12/15/2011 0601   CREATININE 1.58 (H) 04/04/2016 0504   CREATININE 0.88 12/15/2011 0601      Component Value Date/Time   CALCIUM 8.6 (L) 04/04/2016 0504   CALCIUM 8.7 12/15/2011 0601   ALKPHOS 56 03/31/2016 1309   ALKPHOS 65 12/14/2011 0212   AST 39 03/31/2016 1309   AST 24 12/14/2011 0212   ALT 38 03/31/2016 1309   ALT 35 12/14/2011 0212   BILITOT 1.1 03/31/2016 1309   BILITOT 0.4 03/04/2016 1123   BILITOT 0.5 12/14/2011 0212     EXAM: MRI HEAD WITHOUT AND WITH CONTRAST  TECHNIQUE: Multiplanar, multiecho pulse sequences of the brain and surrounding structures were obtained without and with intravenous contrast.  CONTRAST:  49mL MULTIHANCE GADOBENATE DIMEGLUMINE 529 MG/ML IV SOLN  COMPARISON:  None.  FINDINGS: Brain: The brain has normal appearance without evidence of malformation, atrophy, old or acute small or large vessel infarction, hemorrhage, hydrocephalus or extra-axial collection. No pituitary abnormality. After contrast administration, no abnormal enhancement occurs.  Vascular: Major vessels at the base of the brain show flow.  Skull and upper cervical spine: Normal  Sinuses/Orbits: Clear/ normal.  Other: None significant.  IMPRESSION: Normal examination.  No metastatic disease.  EXAM: NUCLEAR MEDICINE PET SKULL BASE TO THIGH  TECHNIQUE: 11.6 mCi F-18 FDG was injected intravenously. Full-ring PET imaging was performed from the skull base to thigh after the radiotracer. CT data was obtained and used for attenuation correction and anatomic localization.  FASTING BLOOD GLUCOSE:  Value: 94 mg/dl  COMPARISON:  Multiple exams, including CT scans of 03/26/2016  FINDINGS: NECK  No hypermetabolic lymph nodes in the neck. Symmetric activity in the palatine tonsils and  anterior tongue ascribed physiologic activity.  Small mucous retention cyst in the right maxillary sinus.  CHEST  There about 15 lung nodules most of which are below 5 mm in diameter. These nodules are not visibly hypermetabolic on PET-CT but are below sensitive PET-CT size thresholds. These nodules are similar in size and distribution compared to 03/26/16. The largest is a subpleural nodule along the left upper lobe which measures 1.2 by 0.5 cm. These nodules were not present on 11/30/2009.  No thoracic adenopathy identified.  There is a chronic band of density in the pericardial adipose tissue below the cardiac apex with short axis diameter 1.3 cm on image 5/2, this may be any elongated lymph node and was present back in 2011 although more narrow at 1.1 cm. This is not significantly hypermetabolic.  ABDOMEN/PELVIS  Interval right nephrectomy without hypermetabolic activity along the nephrectomy bed. No abnormal activity in the liver, spleen, pancreas, adrenal glands, or left kidney.  Prior cholecystectomy. No hypermetabolic adenopathy in the abdomen or pelvis. Postoperative findings along the right anterior abdominal wall.  SKELETON  No focal hypermetabolic activity to suggest skeletal metastasis. Anterior bridging spurring of the right sacroiliac joint.  IMPRESSION: 1. Right nephrectomy with no residual tumor observed in the abdomen or pelvis. 2. There about  15 small lung nodules which are not visibly hypermetabolic but most of which are below sensitive PET-CT size thresholds. The largest in the left upper lobe along the major fissure measures 1.2 by 0.5 cm and is not visibly hypermetabolic, which is somewhat reassuring. On the other hand, the basilar lung nodules were not present on the prior CT scan from 2011, which is not reassuring. Surveillance is probably the best option as percutaneous sampling of any of these lesions would be very tricky and  probably not feasible at this point given the size and location and nodules.     Impression and Plan:   41 year old gentleman with the following issues:  1. Renal cell carcinoma diagnosed in March 2018. He presented with a right kidney mass measuring close to 12 cm x 7.5 cm arising of the lower pole of the right kidney. He is status post laparoscopic right radical nephrectomy completed on 03/31/2016 by Dr. Louis Estrada. 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 No sarcomatoid features noted and no positive margins. The final tumor size was 11 cm. A CT scan did show bilateral pulmonary nodules highly suspicious for malignancy.  The results of the PET/CT scan was reviewed again and discussed with the patient in detail. Options of therapy were reviewed which include continued observation and surveillance and repeat imaging studies in 2 months, obtaining tissue a biopsy which will be difficult, proceeding with systemic therapy. Systemic therapy would be in the form of oral targeted agent versus combined immunotherapy agents.  After discussion today, we have elected to proceed with observation and surveillance at this time and repeat imaging studies in one month. Tissue biopsy could be considered at the time or proceeding with systemic therapy. He is in favor of proceeding with immunotherapy at the time if needed to.  2. CNS metastasis: This has been ruled out by brain MRI obtained on 04/29/2016.  2. Follow-up: Will be in 4 weeks to repeat imaging studies of the chest.  Colen Eltzroth, MD 5/18/20183:06 PM

## 2016-06-10 NOTE — Addendum Note (Signed)
Addendum  created 06/10/16 1249 by Rica Koyanagi, MD   Sign clinical note

## 2016-06-22 ENCOUNTER — Emergency Department (HOSPITAL_COMMUNITY): Payer: Commercial Managed Care - HMO

## 2016-06-22 ENCOUNTER — Telehealth: Payer: Self-pay

## 2016-06-22 ENCOUNTER — Emergency Department (HOSPITAL_COMMUNITY)
Admission: EM | Admit: 2016-06-22 | Discharge: 2016-06-22 | Disposition: A | Discharge status: Home / Self Care | Payer: Commercial Managed Care - HMO | Attending: Emergency Medicine | Admitting: Emergency Medicine

## 2016-06-22 ENCOUNTER — Encounter (HOSPITAL_COMMUNITY): Payer: Self-pay | Admitting: Nurse Practitioner

## 2016-06-22 DIAGNOSIS — F17210 Nicotine dependence, cigarettes, uncomplicated: Secondary | ICD-10-CM | POA: Insufficient documentation

## 2016-06-22 DIAGNOSIS — R55 Syncope and collapse: Secondary | ICD-10-CM | POA: Insufficient documentation

## 2016-06-22 DIAGNOSIS — C641 Malignant neoplasm of right kidney, except renal pelvis: Secondary | ICD-10-CM

## 2016-06-22 DIAGNOSIS — R404 Transient alteration of awareness: Secondary | ICD-10-CM | POA: Diagnosis not present

## 2016-06-22 DIAGNOSIS — C649 Malignant neoplasm of unspecified kidney, except renal pelvis: Secondary | ICD-10-CM | POA: Diagnosis not present

## 2016-06-22 DIAGNOSIS — I10 Essential (primary) hypertension: Secondary | ICD-10-CM | POA: Diagnosis not present

## 2016-06-22 DIAGNOSIS — R42 Dizziness and giddiness: Secondary | ICD-10-CM | POA: Diagnosis not present

## 2016-06-22 DIAGNOSIS — Z79899 Other long term (current) drug therapy: Secondary | ICD-10-CM | POA: Insufficient documentation

## 2016-06-22 HISTORY — DX: Malignant neoplasm of unspecified kidney, except renal pelvis: C64.9

## 2016-06-22 LAB — CBC WITH DIFFERENTIAL/PLATELET
Basophils Absolute: 0.1 10*3/uL (ref 0.0–0.1)
Basophils Relative: 1 %
EOS ABS: 0.2 10*3/uL (ref 0.0–0.7)
Eosinophils Relative: 3 %
HEMATOCRIT: 40.5 % (ref 39.0–52.0)
HEMOGLOBIN: 14.1 g/dL (ref 13.0–17.0)
LYMPHS ABS: 2 10*3/uL (ref 0.7–4.0)
LYMPHS PCT: 34 %
MCH: 28.8 pg (ref 26.0–34.0)
MCHC: 34.8 g/dL (ref 30.0–36.0)
MCV: 82.8 fL (ref 78.0–100.0)
MONOS PCT: 6 %
Monocytes Absolute: 0.4 10*3/uL (ref 0.1–1.0)
NEUTROS ABS: 3.3 10*3/uL (ref 1.7–7.7)
NEUTROS PCT: 56 %
Platelets: 263 10*3/uL (ref 150–400)
RBC: 4.89 MIL/uL (ref 4.22–5.81)
RDW: 13.3 % (ref 11.5–15.5)
WBC: 5.9 10*3/uL (ref 4.0–10.5)

## 2016-06-22 LAB — URINALYSIS, ROUTINE W REFLEX MICROSCOPIC
BILIRUBIN URINE: NEGATIVE
GLUCOSE, UA: NEGATIVE mg/dL
HGB URINE DIPSTICK: NEGATIVE
Ketones, ur: NEGATIVE mg/dL
Leukocytes, UA: NEGATIVE
Nitrite: NEGATIVE
PROTEIN: NEGATIVE mg/dL
SPECIFIC GRAVITY, URINE: 1.019 (ref 1.005–1.030)
pH: 6 (ref 5.0–8.0)

## 2016-06-22 LAB — COMPREHENSIVE METABOLIC PANEL
ALK PHOS: 60 U/L (ref 38–126)
ALT: 23 U/L (ref 17–63)
ANION GAP: 7 (ref 5–15)
AST: 23 U/L (ref 15–41)
Albumin: 4.5 g/dL (ref 3.5–5.0)
BUN: 17 mg/dL (ref 6–20)
CALCIUM: 8.9 mg/dL (ref 8.9–10.3)
CO2: 23 mmol/L (ref 22–32)
CREATININE: 1.45 mg/dL — AB (ref 0.61–1.24)
Chloride: 107 mmol/L (ref 101–111)
GFR calc non Af Amer: 58 mL/min — ABNORMAL LOW (ref >60)
Glucose, Bld: 94 mg/dL (ref 65–99)
Potassium: 3.9 mmol/L (ref 3.5–5.1)
Sodium: 137 mmol/L (ref 135–145)
TOTAL PROTEIN: 8 g/dL (ref 6.5–8.1)
Total Bilirubin: 0.4 mg/dL (ref 0.3–1.2)

## 2016-06-22 LAB — I-STAT TROPONIN, ED: Troponin i, poc: 0 ng/mL (ref 0.00–0.08)

## 2016-06-22 MED ORDER — SODIUM CHLORIDE 0.9 % IV BOLUS (SEPSIS)
1000.0000 mL | Freq: Once | INTRAVENOUS | Status: AC
  Administered 2016-06-22: 1000 mL via INTRAVENOUS

## 2016-06-22 MED ORDER — IOPAMIDOL (ISOVUE-370) INJECTION 76%
INTRAVENOUS | Status: AC
  Administered 2016-06-22: 100 mL via INTRAVENOUS
  Filled 2016-06-22: qty 100

## 2016-06-22 NOTE — Telephone Encounter (Signed)
Pt called at 1150 Returned call at 1344 Pt got lightheaded at work, boss called EMS and he is currently at [facility] ER.

## 2016-06-22 NOTE — ED Provider Notes (Signed)
Patient signed by Dr.yao and chest CT negative for PE. Patient to be discharged home   Lacretia Leigh, MD 06/22/16 1714

## 2016-06-22 NOTE — ED Notes (Signed)
Bed: GC62 Expected date:  Expected time:  Means of arrival:  Comments: EMS-dizzy

## 2016-06-22 NOTE — ED Triage Notes (Signed)
Patient coming from work. Patient had a kidney removed due to cancer tumor 5lbs in march and ever since has been having episodes of dizziness and near today he felt like he was going to pass out.

## 2016-06-22 NOTE — ED Provider Notes (Signed)
[facility] DEPT Provider Note   CSN: [id number] Arrival date & time: 06/22/16  1236     History   Chief Complaint No chief complaint on file.   HPI Shawn Estrada is a 41 y.o. male history of renal cell carcinoma status post resection here presenting with dizziness, lightheadedness. Patient states that in March she was diagnosed with renal cell cancer and had a nephrectomy. Subsequently, he had a PET scan that on 4/20 that showed mets to the lungs and MRI brain that showed no mets to the brain. Patient states that he was referred to [place] and saw a doctor there and was planned to get repeat CT chest next week. He was working and started up quickly and then felt lightheaded and dizzy. Felt like he is on pass out but denies any vertigo symptoms. Denies any abdominal pain or chest pain prior to that episode.   The history is provided by the patient.    Past Medical History:  Diagnosis Date  . Cancer ([place])    kidney  . Cancer determined by renal biopsy ([place])   . Complication of anesthesia    slow to wake up  . Depression   . GERD (gastroesophageal reflux disease)   . Hypertension    No meds prescribed at this time  . PONV (postoperative nausea and vomiting)   . Sleep apnea    cpap    Patient Active Problem List   Diagnosis Date Noted  . Right renal mass 03/31/2016    Past Surgical History:  Procedure Laterality Date  . CHOLECYSTECTOMY    . LAPAROSCOPIC NEPHRECTOMY Right 03/31/2016   Procedure: RIGHT LAPAROSCOPIC RADICAL NEPHRECTOMY;  Surgeon: Ardis Hughs, MD;  Location: [facility];  Service: Urology;  Laterality: Right;       Home Medications    Prior to Admission medications   Medication Sig Start Date End Date Taking? Authorizing Provider  lamoTRIgine (LAMICTAL) 100 MG tablet Take 1 tablet (100 mg total) by mouth daily. 05/09/16  Yes Arfeen, Arlyce Harman, MD  Multiple Vitamin (MULTIVITAMIN WITH MINERALS) TABS tablet Take 1 tablet by mouth daily.   Yes  [provider]  omeprazole (PRILOSEC) 20 MG capsule Take 20 mg by mouth daily.   Yes [provider]    Family History Family History  Problem Relation Age of Onset  . Depression Sister     Social History Social History  Substance Use Topics  . Smoking status: Current Some Day Smoker    Packs/day: 0.25    Years: 26.00    Types: Cigarettes    Start date: 10/23/1989  . Smokeless tobacco: Current User    Types: Snuff     Comment: smokes about 10 cigarettes every 2 weeks.   . Alcohol use No     Comment: Occasional use     Allergies   Nsaids   Review of Systems Review of Systems  Neurological: Positive for dizziness.  All other systems reviewed and are negative.    Physical Exam Updated Vital Signs BP 134/84 (BP Location: Right Arm)   Pulse 65   Temp 98.2 F (36.8 C) (Oral)   Resp 12   Ht 6\' 2"  (1.88 m)   Wt 113.4 kg (250 lb)   SpO2 95%   BMI 32.10 kg/m   Physical Exam  Constitutional: He is oriented to person, place, and time.  Slightly dehydrated   HENT:  Head: Normocephalic.  MM slightly dry   Eyes: EOM are normal. Pupils are  equal, round, and reactive to light.  No nystagmus   Neck: Normal range of motion. Neck supple.  Cardiovascular: Normal rate, regular rhythm, normal heart sounds and intact distal pulses.   Pulmonary/Chest: Effort normal and breath sounds normal. No respiratory distress. He has no wheezes. He has no rales.  Abdominal: Soft. Bowel sounds are normal. He exhibits no distension. There is no tenderness. There is no guarding.  Previous surgical scars healing well, nontender abdomen   Musculoskeletal: Normal range of motion. He exhibits no edema.  Neurological: He is alert and oriented to person, place, and time. No cranial nerve deficit. Coordination normal.  CN 2-12 intact. Nl strength throughout. Nl finger to nose. Nl gait   Skin: Skin is warm.  Psychiatric: He has a normal mood and affect.  Nursing note and vitals  reviewed.    ED Treatments / Results  Labs (all labs ordered are listed, but only abnormal results are displayed) Labs Reviewed  COMPREHENSIVE METABOLIC PANEL - Abnormal; Notable for the following:       Result Value   Creatinine, Ser 1.45 (*)    GFR calc non Af Amer 58 (*)    All other components within normal limits  CBC WITH DIFFERENTIAL/PLATELET  URINALYSIS, ROUTINE W REFLEX MICROSCOPIC  I-STAT TROPOININ, ED    EKG  EKG Interpretation  Date/Time:  Wednesday June 22 2016 12:51:52 EDT Ventricular Rate:  71 PR Interval:    QRS Duration: 97 QT Interval:  394 QTC Calculation: 429 R Axis:   10 Text Interpretation:  Sinus rhythm No significant change since last tracing Confirmed by Wandra Arthurs [id number]) on 06/22/2016 1:11:49 PM       Radiology No results found.  Procedures Procedures (including critical care time)  Medications Ordered in ED Medications  sodium chloride 0.9 % bolus 1,000 mL (1,000 mLs Intravenous New Bag/Given 06/22/16 1331)     Initial Impression / Assessment and Plan / ED Course  I have reviewed the triage vital signs and the nursing notes.  Pertinent labs & imaging results that were available during my care of the patient were reviewed by me and considered in my medical decision making (see chart for details).     Shawn Estrada is a 41 y.o. male here with lightheadedness, dizziness. This has been going on for months since the diagnosis of renal cell cancer. Has known mets to lung. Consider PE. MRI brain recently showed no mets to the brain and he has nonfocal neuro exam. Will get labs, CT angio chest, orthostatics. Will hydrate and reassess. Has oncology follow up already.   3:55 PM Labs unremarkable. UA nl. Not orthostatic. CT angio chest pending. It will likely show metastatic disease. As long as it doesn't show PE or other significant findings, anticipate discharge home. Patient has oncology follow up next week. Signed out to Dr. Zenia Resides in the  ED.   Final Clinical Impressions(s) / ED Diagnoses   Final diagnoses:  Syncope    New Prescriptions New Prescriptions   No medications on file     Drenda Freeze, MD 06/22/16 1557

## 2016-06-22 NOTE — Discharge Instructions (Signed)
Stay hydrated.   Rest for several days.   Follow up with Dr. Alen Blew next week   Return to ER if you have worse dizziness, passing out, chest pain, abdominal pain.

## 2016-06-27 ENCOUNTER — Ambulatory Visit (HOSPITAL_COMMUNITY): Admission: RE | Admit: 2016-06-27 | Payer: Commercial Managed Care - HMO | Source: Ambulatory Visit

## 2016-06-28 ENCOUNTER — Ambulatory Visit (HOSPITAL_BASED_OUTPATIENT_CLINIC_OR_DEPARTMENT_OTHER): Payer: Commercial Managed Care - HMO | Admitting: Oncology

## 2016-06-28 ENCOUNTER — Telehealth: Payer: Self-pay | Admitting: Oncology

## 2016-06-28 VITALS — BP 135/91 | HR 104 | Temp 98.4°F | Resp 18 | Ht 74.0 in | Wt 246.9 lb

## 2016-06-28 DIAGNOSIS — C641 Malignant neoplasm of right kidney, except renal pelvis: Secondary | ICD-10-CM

## 2016-06-28 DIAGNOSIS — C649 Malignant neoplasm of unspecified kidney, except renal pelvis: Secondary | ICD-10-CM

## 2016-06-28 DIAGNOSIS — R911 Solitary pulmonary nodule: Secondary | ICD-10-CM

## 2016-06-28 NOTE — Telephone Encounter (Signed)
Scheduled appt per 6/19 los - patient prefer f/u on Friday - Gave patient AVS and calender per LOS.

## 2016-06-28 NOTE — Progress Notes (Signed)
Hematology and Oncology Follow Up Visit  Shawn Estrada [id number] November 03, 1975 41 y.o. 06/28/2016 4:09 PM Redmon, Barth Kirks, PA-CRedmon, Noelle, PA-C   Principle Diagnosis: 41 year old gentleman with renal cell carcinoma diagnosed in March 2018. He presented with a large kidney mass measuring 12 x 7.5 cm. He has pulmonary nodules suspicious for metastatic disease.   Prior Therapy: He is status post laparoscopic right radical nephrectomy completed on 03/31/2016 by Dr. Louis Meckel. 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 No sarcomatoid features noted and no positive margins. The final tumor size was 11 cm. A CT scan did show bilateral pulmonary nodules highly suspicious for malignancy.  Current therapy: Observation and surveillance and under consideration for possible systemic therapy.  Interim History: Shawn Estrada presents today for a follow-up visit. Since the last visit, he reports feeling reasonably well. He did develop dizziness episode while he was at work and was evaluated in the emergency department on 06/22/2016. His workup was unrevealing and he completed CT scan of the chest which did not show any pulmonary embolism. These symptoms have resolved and he continues to work full time. He denied any cough, wheezing or any other pulmonary symptoms. His performance status and activity level remain excellent.  He does not report any headaches, blurry vision, syncope or seizures. He does not report any fevers, chills, sweats or weight loss. He is not reporting any chest pain, palpitation, orthopnea or leg edema. He does not report any cough, wheezing or hemoptysis. He does not report any nausea, vomiting or abdominal pain. He does not report any arthralgias, myalgias. He does not report any skeletal complaints. He does not report any petechiae or excessive bleeding. He does not report any skin rash or ecchymosis. Remaining review of systems unremarkable.   Medications: I  have reviewed the patient's current medications.  Current Outpatient Prescriptions  Medication Sig Dispense Refill  . lamoTRIgine (LAMICTAL) 100 MG tablet Take 1 tablet (100 mg total) by mouth daily. 90 tablet 0  . Multiple Vitamin (MULTIVITAMIN WITH MINERALS) TABS tablet Take 1 tablet by mouth daily.    Marland Kitchen omeprazole (PRILOSEC) 20 MG capsule Take 20 mg by mouth daily.     No current facility-administered medications for this visit.      Allergies:  Allergies  Allergen Reactions  . 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   Physical Exam: Blood pressure (!) 135/91, pulse (!) 104, temperature 98.4 F (36.9 C), temperature source Oral, resp. rate 18, height 6\' 2"  (1.88 m), weight 246 lb 14.4 oz (112 kg), SpO2 98 %. ECOG: 0 General appearance: Well-appearing gentleman without distress. Head: Normocephalic, without obvious abnormality no oral thrush or ulcers. Neck: no adenopathy Lymph nodes: Cervical, supraclavicular, and axillary nodes normal. Heart:regular rate and rhythm, S1, S2 normal, no murmur, click, rub or gallop Lung:chest clear, no wheezing, rales, normal symmetric air entry. Abdomin: soft, non-tender, without masses or organomegaly no shifting dullness or ascites. EXT:no erythema, induration, or nodules   Lab Results: Lab Results  Component Value Date   WBC 5.9 06/22/2016   HGB 14.1 06/22/2016   HCT 40.5 06/22/2016   MCV 82.8 06/22/2016   PLT 263 06/22/2016     Chemistry      Component Value Date/Time   NA 137 06/22/2016 1319   NA 138 05/27/2016 1442   K 3.9  06/22/2016 1319   K 4.1 05/27/2016 1442   CL 107 06/22/2016 1319   CL 105 12/15/2011 0601   CO2 23 06/22/2016 1319   CO2 26 05/27/2016 1442   BUN 17 06/22/2016 1319   BUN 14.2 05/27/2016 1442   CREATININE 1.45 (H) 06/22/2016 1319   CREATININE 1.5 (H) 05/27/2016  1442      Component Value Date/Time   CALCIUM 8.9 06/22/2016 1319   CALCIUM 9.3 05/27/2016 1442   ALKPHOS 60 06/22/2016 1319   ALKPHOS 64 05/27/2016 1442   AST 23 06/22/2016 1319   AST 17 05/27/2016 1442   ALT 23 06/22/2016 1319   ALT 23 05/27/2016 1442   BILITOT 0.4 06/22/2016 1319   BILITOT 0.42 05/27/2016 1442     EXAM: CT ANGIOGRAPHY CHEST WITH CONTRAST  TECHNIQUE: Multidetector CT imaging of the chest was performed using the standard protocol during bolus administration of intravenous contrast. Multiplanar CT image reconstructions and MIPs were obtained to evaluate the vascular anatomy.  CONTRAST:  100 cc Isovue 370  COMPARISON:  Chest CT dated 03/26/2016.  FINDINGS: Cardiovascular: Some of the peripheral segmental and subsegmental pulmonary arteries are difficult to characterize due to mild patient breathing motion artifact, particularly the left lower lobe segmental pulmonary arteries, but there is no convincing pulmonary embolism identified within the main, lobar or segmental pulmonary arteries bilaterally.  No aortic aneurysm or dissection. Heart size is upper normal. No pericardial effusion.  Mediastinum/Nodes: No mass or enlarged lymph nodes seen within the mediastinum or perihilar regions. Scattered small lymph nodes within the mediastinum, unchanged in the short-term interval, not pathologic per interval PET-CT report of 04/29/2016.  Esophagus appears normal. Trachea and central bronchi are unremarkable.  Lungs/Pleura: Scattered small pulmonary nodules are again seen throughout both lungs, 15 separate nodules described on recent PET-CT, not appreciably changed in the interval, largest nodule within the inferior aspect of the left upper lobe again measuring 10 mm greatest dimension (series 11, image 38), questionable slight enlargement of a 7 mm pulmonary nodule within the left lower lobe (series 11, image 60).  No pneumonia or pleural  effusion.  No pneumothorax.  Upper Abdomen: No acute findings.  Status post cholecystectomy.  Musculoskeletal: No acute or suspicious osseous finding. Superficial soft tissues are unremarkable.  Review of the MIP images confirms the above findings.  IMPRESSION: 1. No acute findings. No pulmonary embolism seen, with mild study limitations detailed above. No aortic aneurysm or dissection. No pericardial effusion. No pneumonia or pleural effusion. 2. Numerous small pulmonary nodules, not appreciably changed compared to recent PET-CT of 04/29/2016, questionable slight enlargement of a 7 mm pulmonary nodule within the left lower lobe.   41 year old gentleman with the following issues:  1. Renal cell carcinoma diagnosed in March 2018. He presented with a right kidney mass measuring close to 12 cm x 7.5 cm arising of the lower pole of the right kidney. He is status post laparoscopic right radical nephrectomy completed on 03/31/2016 by Dr. Louis Meckel. 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 No sarcomatoid features noted and no positive margins. The final tumor size was 11 cm. A CT scan did show bilateral pulmonary nodules highly suspicious for malignancy.  Repeat CT scan on 06/22/2016 was reviewed today and continues to show stable pulmonary nodules. These findings are suspicious for metastatic disease but given their stability, we have opted to continued observation and surveillance. We will repeat imaging studies in 3 months and consider biopsy and possible systemic therapy  if there is any enlargement noted.  2. CNS metastasis: This has been ruled out by brain MRI obtained on 04/29/2016.  3. Follow-up: Will be in 3 months to repeat imaging studies.   [facility], MD 6/19/20184:09 PM

## 2016-06-29 ENCOUNTER — Telehealth: Payer: Self-pay | Admitting: Oncology

## 2016-06-29 NOTE — Telephone Encounter (Signed)
Faxed records to [facility] °

## 2016-07-06 ENCOUNTER — Telehealth: Payer: Self-pay | Admitting: *Deleted

## 2016-07-06 ENCOUNTER — Encounter: Payer: Self-pay | Admitting: *Deleted

## 2016-07-06 NOTE — Telephone Encounter (Signed)
Last 2 scans, ct angio and brain MRI, to be burned to cd and mailed to dr Anderson Malta atlas at [facility]. Information given to amy in radiology.

## 2016-08-01 DIAGNOSIS — C649 Malignant neoplasm of unspecified kidney, except renal pelvis: Secondary | ICD-10-CM | POA: Diagnosis not present

## 2016-08-03 ENCOUNTER — Telehealth: Payer: Self-pay

## 2016-08-03 ENCOUNTER — Telehealth: Payer: Self-pay | Admitting: Oncology

## 2016-08-03 ENCOUNTER — Other Ambulatory Visit: Payer: Self-pay | Admitting: Oncology

## 2016-08-03 ENCOUNTER — Telehealth: Payer: Self-pay | Admitting: *Deleted

## 2016-08-03 DIAGNOSIS — N2889 Other specified disorders of kidney and ureter: Secondary | ICD-10-CM

## 2016-08-03 NOTE — Telephone Encounter (Signed)
Let him know that he needs to come for labs + MD on 7/26 at 1 pm. Message sent to scheduling.

## 2016-08-03 NOTE — Telephone Encounter (Signed)
Patient notified of lab cancellation and appt removed from epic

## 2016-08-03 NOTE — Telephone Encounter (Signed)
sw pt to confirm 7/26 appt at 1 pm per sch msg. Pt stated he had labs done on Monday and will bring results to visit

## 2016-08-03 NOTE — Telephone Encounter (Signed)
Mother is questioning need for labs since he had them drawn on Monday and we should have a copy.

## 2016-08-03 NOTE — Telephone Encounter (Signed)
Pt's mom called today. The pt asked her to call. The pt is dizzyness and palor, feels like he is going to pass out. A recurrent thing, today work sent him home by ambulance as soon as he showed up at work. His labs on Monday from the cancer center in [place] showed increased creatinine and total protein. His abdomen is sore and a little painful on the left this morning as well. The family is getting antsy and concerned, and want to know if Dr Alen Blew wants to see him today or soon. His next appt is labs 9/17 and MD 9/21.   The labs from Monday should have been faxed to [facility] today/ this morning.  Judy[facility] [phone]

## 2016-08-03 NOTE — Telephone Encounter (Signed)
lvm with mother for labs about 1230 and Dr Alen Blew at 1300 tomorrow.

## 2016-08-04 ENCOUNTER — Telehealth: Payer: Self-pay | Admitting: *Deleted

## 2016-08-04 ENCOUNTER — Other Ambulatory Visit: Payer: Self-pay

## 2016-08-04 ENCOUNTER — Ambulatory Visit: Payer: Self-pay | Admitting: Oncology

## 2016-08-04 NOTE — Telephone Encounter (Signed)
Received voice mail call stating,"i'm having fever and diarrhea today. Going to PCP today. Please cancel appointment with Dr. Alen Blew and I'll call to reschedule.

## 2016-08-05 NOTE — Telephone Encounter (Signed)
Patient called and stated,"my fever broke, I'm eating and drinking without any problems, no blood in my urine. However, I do have a trace of blood mixed in my stool. Everytime I have a BM I have pain." Per Dr. Alen Blew, he doesn't need to go to the ER. Instructed patient to monitor his temperatures, increase his drinking and observe his stools to see if there is anymore blood. If he starts to have fevers and increased blood in stools, go to the nearest ER. Please contact your PCP for an appointment. Patient verbalized understanding.

## 2016-08-08 ENCOUNTER — Telehealth (HOSPITAL_COMMUNITY): Payer: Self-pay

## 2016-08-08 NOTE — Telephone Encounter (Signed)
Patient is calling because he was recently diagnosed with Cancer and the doctor wants him to come off of the Lamictal for a short time. Patient knows that he needs to taper off and would like to know what you recommend. Please review and advise, thank you

## 2016-08-09 NOTE — Telephone Encounter (Signed)
He is taking Lamictal 100 mg daily.  He can cut down to 50 mg for 2 weeks and then stopped.  His symptoms may come back after stopping the medication and if that happens then he need to call us back.

## 2016-08-11 NOTE — Telephone Encounter (Signed)
Called patient and let him know.

## 2016-08-12 ENCOUNTER — Ambulatory Visit (HOSPITAL_COMMUNITY): Payer: Commercial Managed Care - HMO | Admitting: Psychiatry

## 2016-08-15 ENCOUNTER — Telehealth: Payer: Self-pay | Admitting: Oncology

## 2016-08-15 NOTE — Telephone Encounter (Signed)
Faxed records to [facility]. [phone]

## 2016-08-23 DIAGNOSIS — R911 Solitary pulmonary nodule: Secondary | ICD-10-CM | POA: Diagnosis not present

## 2016-08-23 DIAGNOSIS — K219 Gastro-esophageal reflux disease without esophagitis: Secondary | ICD-10-CM | POA: Diagnosis not present

## 2016-08-23 DIAGNOSIS — Z1389 Encounter for screening for other disorder: Secondary | ICD-10-CM | POA: Diagnosis not present

## 2016-08-23 DIAGNOSIS — C641 Malignant neoplasm of right kidney, except renal pelvis: Secondary | ICD-10-CM | POA: Diagnosis not present

## 2016-08-27 ENCOUNTER — Other Ambulatory Visit (HOSPITAL_COMMUNITY): Payer: Self-pay | Admitting: Psychiatry

## 2016-08-27 DIAGNOSIS — F331 Major depressive disorder, recurrent, moderate: Secondary | ICD-10-CM

## 2016-09-07 ENCOUNTER — Telehealth (HOSPITAL_COMMUNITY): Payer: Self-pay

## 2016-09-07 NOTE — Telephone Encounter (Signed)
Medication refill request - Fax received from [facility] in [place] requesting a refill of patient's past prescribed Lamictal.  Pt. last seen 05/09/16 and got a 90 day order that day.  He canceled 08/12/16 and has not rescheduled as of this date.

## 2016-09-07 NOTE — Telephone Encounter (Signed)
Needs appointment for refills.

## 2016-09-20 ENCOUNTER — Telehealth: Payer: Self-pay | Admitting: *Deleted

## 2016-09-20 NOTE — Telephone Encounter (Signed)
This RN returned patient's phone call regarding CT of chest. Patient stated," I wonder if  Dr. Alen Blew could add the abdomen to my CT scan. He could look at my lungs and abdomen?" Instructed patient that I would give this message to Dr. Alen Blew. Return number is [phone].

## 2016-09-20 NOTE — Telephone Encounter (Signed)
We need to add CT abd/pelvis as well.

## 2016-09-21 ENCOUNTER — Other Ambulatory Visit: Payer: Self-pay | Admitting: *Deleted

## 2016-09-21 DIAGNOSIS — N2889 Other specified disorders of kidney and ureter: Secondary | ICD-10-CM

## 2016-09-23 ENCOUNTER — Other Ambulatory Visit: Payer: Self-pay | Admitting: Oncology

## 2016-09-23 ENCOUNTER — Ambulatory Visit (HOSPITAL_COMMUNITY)
Admission: RE | Admit: 2016-09-23 | Discharge: 2016-09-23 | Disposition: A | Discharge status: Home / Self Care | Payer: 59 | Source: Ambulatory Visit | Attending: Oncology | Admitting: Oncology

## 2016-09-23 ENCOUNTER — Other Ambulatory Visit (HOSPITAL_BASED_OUTPATIENT_CLINIC_OR_DEPARTMENT_OTHER): Payer: 59

## 2016-09-23 DIAGNOSIS — R918 Other nonspecific abnormal finding of lung field: Secondary | ICD-10-CM | POA: Diagnosis not present

## 2016-09-23 DIAGNOSIS — Z905 Acquired absence of kidney: Secondary | ICD-10-CM | POA: Diagnosis not present

## 2016-09-23 DIAGNOSIS — K869 Disease of pancreas, unspecified: Secondary | ICD-10-CM | POA: Insufficient documentation

## 2016-09-23 DIAGNOSIS — N2889 Other specified disorders of kidney and ureter: Secondary | ICD-10-CM | POA: Diagnosis not present

## 2016-09-23 DIAGNOSIS — C641 Malignant neoplasm of right kidney, except renal pelvis: Secondary | ICD-10-CM

## 2016-09-23 DIAGNOSIS — C649 Malignant neoplasm of unspecified kidney, except renal pelvis: Secondary | ICD-10-CM

## 2016-09-23 DIAGNOSIS — E278 Other specified disorders of adrenal gland: Secondary | ICD-10-CM | POA: Insufficient documentation

## 2016-09-23 DIAGNOSIS — E168 Other specified disorders of pancreatic internal secretion: Secondary | ICD-10-CM | POA: Diagnosis not present

## 2016-09-23 LAB — COMPREHENSIVE METABOLIC PANEL
ALK PHOS: 62 U/L (ref 40–150)
ALT: 27 U/L (ref 0–55)
AST: 21 U/L (ref 5–34)
Albumin: 4.4 g/dL (ref 3.5–5.0)
Anion Gap: 11 mEq/L (ref 3–11)
BILIRUBIN TOTAL: 0.71 mg/dL (ref 0.20–1.20)
BUN: 15.7 mg/dL (ref 7.0–26.0)
CHLORIDE: 104 meq/L (ref 98–109)
CO2: 22 mEq/L (ref 22–29)
CREATININE: 1.6 mg/dL — AB (ref 0.7–1.3)
Calcium: 9.6 mg/dL (ref 8.4–10.4)
EGFR: 54 mL/min/{1.73_m2} — AB (ref >90)
GLUCOSE: 90 mg/dL (ref 70–140)
POTASSIUM: 4.1 meq/L (ref 3.5–5.1)
SODIUM: 137 meq/L (ref 136–145)
Total Protein: 8.1 g/dL (ref 6.4–8.3)

## 2016-09-23 LAB — CBC WITH DIFFERENTIAL/PLATELET
BASO%: 1.5 % (ref 0.0–2.0)
BASOS ABS: 0.1 10*3/uL (ref 0.0–0.1)
EOS ABS: 0.2 10*3/uL (ref 0.0–0.5)
EOS%: 2.4 % (ref 0.0–7.0)
HCT: 42.6 % (ref 38.4–49.9)
HGB: 14.7 g/dL (ref 13.0–17.1)
LYMPH%: 40.4 % (ref 14.0–49.0)
MCH: 30.7 pg (ref 27.2–33.4)
MCHC: 34.5 g/dL (ref 32.0–36.0)
MCV: 88.8 fL (ref 79.3–98.0)
MONO#: 0.4 10*3/uL (ref 0.1–0.9)
MONO%: 6.2 % (ref 0.0–14.0)
NEUT#: 3.3 10*3/uL (ref 1.5–6.5)
NEUT%: 49.5 % (ref 39.0–75.0)
Platelets: 303 10*3/uL (ref 140–400)
RBC: 4.8 10*6/uL (ref 4.20–5.82)
RDW: 14.5 % (ref 11.0–14.6)
WBC: 6.6 10*3/uL (ref 4.0–10.3)
lymph#: 2.7 10*3/uL (ref 0.9–3.3)

## 2016-09-23 MED ORDER — IOPAMIDOL (ISOVUE-300) INJECTION 61%
100.0000 mL | Freq: Once | INTRAVENOUS | Status: AC | PRN
  Administered 2016-09-23: 100 mL via INTRAVENOUS

## 2016-09-23 MED ORDER — IOPAMIDOL (ISOVUE-300) INJECTION 61%
INTRAVENOUS | Status: AC
  Filled 2016-09-23: qty 100

## 2016-09-26 ENCOUNTER — Telehealth: Payer: Self-pay | Admitting: Oncology

## 2016-09-26 ENCOUNTER — Ambulatory Visit (HOSPITAL_BASED_OUTPATIENT_CLINIC_OR_DEPARTMENT_OTHER): Payer: 59 | Admitting: Oncology

## 2016-09-26 ENCOUNTER — Other Ambulatory Visit: Payer: Self-pay

## 2016-09-26 DIAGNOSIS — C649 Malignant neoplasm of unspecified kidney, except renal pelvis: Secondary | ICD-10-CM | POA: Insufficient documentation

## 2016-09-26 DIAGNOSIS — C641 Malignant neoplasm of right kidney, except renal pelvis: Secondary | ICD-10-CM

## 2016-09-26 DIAGNOSIS — Z7189 Other specified counseling: Secondary | ICD-10-CM | POA: Insufficient documentation

## 2016-09-26 MED ORDER — PROCHLORPERAZINE MALEATE 10 MG PO TABS: 10.0000 mg | ORAL_TABLET | Freq: Four times a day (QID) | ORAL | 0 refills | Status: DC | PRN

## 2016-09-26 NOTE — Progress Notes (Signed)
DISCONTINUE OFF PATHWAY REGIMEN - Renal Cell   [id number]:Cabozantinib (Cabometyx(TM)) 60 mg Daily - RCC:   Daily:     Cabozantinib (tablet)   **Always confirm dose/schedule in your pharmacy ordering system**    REASON: Other Reason PRIOR TREATMENT: Off Pathway: Cabozantinib (Cabometyx(TM)) 60 mg Daily - [facility] TREATMENT RESPONSE: Unable to Evaluate  START ON PATHWAY REGIMEN - Renal Cell     A cycle is every 21 days:     Nivolumab      Ipilimumab    A cycle is every 28 days:     Nivolumab   **Always confirm dose/schedule in your pharmacy ordering system**    Patient Characteristics: Metastatic, Clear Cell, First Line, Intermediate or Poor Risk AJCC M Category: Staged < 8th Ed. AJCC 8 Stage Grouping: IV Current evidence of distant metastases<= Yes AJCC T Category: TX AJCC N Category: NX Does patient have oligometastatic disease<= No Would you be surprised if this patient died  in the next year<= I would be surprised if this patient died in the next year Histology: Clear Cell Line of therapy: First Line Risk Status: Poor Risk Intent of Therapy: Non-Curative / Palliative Intent, Discussed with Patient

## 2016-09-26 NOTE — Telephone Encounter (Signed)
Gave avs and calendar for September and october °

## 2016-09-26 NOTE — Progress Notes (Signed)
Hematology and Oncology Follow Up Visit  Shawn Estrada [id number] 04-May-1975 41 y.o. 09/26/2016 11:33 AM Avva, [name], MD   Principle Diagnosis: 41 year old gentleman with renal cell carcinoma diagnosed in March 2018. He presented with a large kidney mass measuring 12 x 7.5 cm. He has pulmonary nodules suspicious for metastatic disease.   Prior Therapy: He is status post laparoscopic right radical nephrectomy completed on 03/31/2016 by Dr. Louis Meckel. 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 No sarcomatoid features noted and no positive margins. The final tumor size was 11 cm. A CT scan did show bilateral pulmonary nodules highly suspicious for malignancy.  Current therapy: Under consideration to start systemic therapy in the form of combined immunotherapy. Tentatively he will receive Ipilimumab/Nivolumab (ipi/nivo) in the near future.  Interim History: Mr. Stead presents today for a follow-up visit with his family. Since the last visit, he reports no major changes in his health. He continues to have periodic dizziness but no headaches or syncope. He continues with limited duties because of these concerns. He does not report any neurological deficits. His appetite has been excellent. He does report abdominal soreness predominantly in the right side of the abdomen. No change in his bowel habits. No respiratory symptoms.  He does not report any headaches, blurry vision, syncope or seizures. He does not report any fevers, chills, sweats or weight loss. He is not reporting any chest pain, palpitation, orthopnea or leg edema. He does not report any cough, wheezing or hemoptysis. He does not report any nausea, vomiting or abdominal pain. He does not report any arthralgias, myalgias. He does not report any skeletal complaints. He does not report any petechiae or excessive bleeding. He does not report any skin rash or ecchymosis. Remaining review of  systems unremarkable.   Medications: I have reviewed the patient's current medications.  Current Outpatient Prescriptions  Medication Sig Dispense Refill  . lamoTRIgine (LAMICTAL) 100 MG tablet Take 1 tablet (100 mg total) by mouth daily. 90 tablet 0  . Multiple Vitamin (MULTIVITAMIN WITH MINERALS) TABS tablet Take 1 tablet by mouth daily.    Marland Kitchen omeprazole (PRILOSEC) 20 MG capsule Take 20 mg by mouth daily.     No current facility-administered medications for this visit.      Allergies:  Allergies  Allergen Reactions  . 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   Physical Exam: Blood pressure (!) 143/89, pulse 80, temperature 98.5 F (36.9 C), temperature source Oral, resp. rate 18, height 6\' 2"  (1.88 m), weight 255 lb 8 oz (115.9 kg), SpO2 98 %. ECOG: 0 General appearance: Alert, awake gentleman without distress. Head: Normocephalic, without obvious abnormality no oral thrush or ulcers. Neck: no adenopathy Lymph nodes: Cervical, supraclavicular, and axillary nodes normal. Heart:regular rate and rhythm, S1, S2 normal, no murmur, click, rub or gallop Lung:chest clear, no wheezing, rales, normal symmetric air entry. Abdomin: soft, non-tender, without masses or organomegaly no rebound or guarding. EXT:no erythema, induration, or nodules   Lab Results: Lab Results  Component Value Date   WBC 6.6 09/23/2016   HGB 14.7 09/23/2016   HCT 42.6 09/23/2016   MCV 88.8 09/23/2016   PLT 303 09/23/2016     Chemistry      Component Value Date/Time   NA 137 09/23/2016 0959   K 4.1 09/23/2016 0959  CL 107 06/22/2016 1319   CL 105 12/15/2011 0601   CO2 22 09/23/2016 0959   BUN 15.7 09/23/2016 0959   CREATININE 1.6 (H) 09/23/2016 0959      Component Value Date/Time   CALCIUM 9.6 09/23/2016 0959   ALKPHOS 62 09/23/2016 0959   AST 21  09/23/2016 0959   ALT 27 09/23/2016 0959   BILITOT 0.71 09/23/2016 0959     EXAM: CT CHEST, ABDOMEN, AND PELVIS WITH CONTRAST  TECHNIQUE: Multidetector CT imaging of the chest, abdomen and pelvis was performed following the standard protocol during bolus administration of intravenous contrast.  CONTRAST:  161mL ISOVUE-300 IOPAMIDOL (ISOVUE-300) INJECTION 61%  COMPARISON:  PET-CT 04/29/2016. CT of the chest, abdomen and pelvis 03/26/2016.  FINDINGS: CT CHEST FINDINGS  Cardiovascular: Heart size is normal. There is no significant pericardial fluid, thickening or pericardial calcification. No significant atherosclerotic disease noted in the thoracic aorta. No coronary artery calcifications.  Mediastinum/Nodes: Prominent juxtapericardial lymph node adjacent to the cardiac apex, similar to prior studies dating back to 2011, presumably benign. No other pathologically enlarged mediastinal or hilar lymph nodes. Esophagus is unremarkable in appearance. No axillary lymphadenopathy.  Lungs/Pleura: Again noted are numerous small pulmonary nodules scattered throughout both lungs, which appear similar in size, number and distribution to the prior examinations. The largest of these nodules measures 12 x 5 mm in the posterior aspect of the left upper lobe (axial image 59 of series 13). While none of these nodules appear definitively new or have clearly enlarged compared to the prior examinations, these nodules are definitely new compared to remote prior study from 2011, and are presumably indicative of metastatic disease. No acute consolidative airspace disease. No pleural effusions.  Musculoskeletal: There are no aggressive appearing lytic or blastic lesions noted in the visualized portions of the skeleton.  CT ABDOMEN PELVIS FINDINGS  Hepatobiliary: No cystic or solid hepatic lesions. No intra or extrahepatic biliary ductal dilatation. Status post  cholecystectomy.  Pancreas: Small hypervascular lesion in the head of the pancreas measuring 11 x 9 mm (axial image 48 of series 3), new compared to prior study from 03/26/2016. The remainder of the pancreas is otherwise normal in appearance. No pancreatic ductal dilatation. No pancreatic or peripancreatic fluid or inflammatory changes.  Spleen: Unremarkable.  Adrenals/Urinary Tract: Status post right nephrectomy. Adjacent to the medial aspect of the nephrectomy bed there is some avidly enhancing soft tissue measuring 1.9 x 1.2 x 3.8 cm (axial image 49 of series 3 and coronal image 69 of series 11), highly concerning for residual disease. There is also a 7 mm area of soft tissue nodularity and hyperenhancement along the lateral surface of the right psoas muscle inferior to the nephrectomy bed (axial image 85 of series 3), also likely a focus of residual disease. Left kidney is normal in appearance. 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 Right adrenal gland is normal in appearance. No left-sided hydroureteronephrosis. Urinary bladder is grossly normal in appearance.  Stomach/Bowel: The appearance of the stomach is normal. There is no pathologic dilatation of small bowel or colon. Normal appendix.  Vascular/Lymphatic: No significant atherosclerotic disease, aneurysm or dissection noted in the abdominal or pelvic vasculature. Enhancing soft tissue in the medial aspect of the right nephrectomy bed (discussed above) could reflect residual disease or right retroperitoneal lymphadenopathy. No additional sites of lymphadenopathy are noted elsewhere in the abdomen or pelvis.  Reproductive: Prostate gland and seminal  vesicles are unremarkable in appearance.  Other: Several small enhancing soft tissue nodules are noted adjacent to the hepatic flexure of the colon on axial image 49,  55 and 59 of series 3, measuring up to 1 cm, highly concerning for peritoneal implants. No significant volume of ascites. No pneumoperitoneum.  Musculoskeletal: There are no aggressive appearing lytic or blastic lesions noted in the visualized portions of the skeleton.  IMPRESSION: 1. Today's study demonstrates enhancing soft tissue in the nephrectomy bed which may represent residual/recurrent disease. 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 2. Additional incidental findings, as above.     41 year old gentleman with the following issues:  1. Renal cell carcinoma diagnosed in March 2018. He presented with a right kidney mass measuring close to 12 cm x 7.5 cm arising of the lower pole of the right kidney. He is status post laparoscopic right radical nephrectomy completed on 03/31/2016 by Dr. Louis Meckel. 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 No sarcomatoid features noted and no positive margins. The final tumor size was 11 cm. A CT scan did show bilateral pulmonary nodules highly suspicious for malignancy.  CT scan obtained on 09/23/2016 was discussed today and showed evidence of recurrent disease in the pancreatic bed and possible suspicious metastasis to the adrenal gland as well as 11 x 9 mm in the pancreatic head that is suspicious for pancreatic metastasis.  These findings were discussed today in detail with the patient and his family. These findings necessitate starting systemic therapy.  These options include oral targeted therapy versus combined immunotherapy with Ipi/Nivo.   Complications associated with both these therapies were reviewed today in detail. Given the aggressive nature of his malignancy, I'm in favor of using combined immunotherapy. Complications associated with this medication include fatigue, tiredness, pruritus, diarrhea, colitis, pneumonitis and thyroid disease. 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 After discussion today, he is agreeable to proceed in the near future.  He does have a follow-up at the [facility] this week for a second opinion. I encouraged him to keep that appointment as well. He will likely proceed with his therapy locally.   2. CNS metastasis: This has been ruled o   3. Immune mediated complications: He will have thyroid function checked periodically. I have educated him about reporting any symptoms of diarrhea, cough or excessive fatigue as soon as possible.  4. Follow-up: Will be in the immediate future to start therapy.  Zola Button MD 09/26/16

## 2016-09-28 ENCOUNTER — Encounter: Payer: Self-pay | Admitting: *Deleted

## 2016-09-29 DIAGNOSIS — C649 Malignant neoplasm of unspecified kidney, except renal pelvis: Secondary | ICD-10-CM | POA: Diagnosis not present

## 2016-09-30 ENCOUNTER — Ambulatory Visit: Payer: Self-pay | Admitting: Oncology

## 2016-10-03 ENCOUNTER — Other Ambulatory Visit: Payer: Self-pay | Admitting: Oncology

## 2016-10-03 ENCOUNTER — Telehealth: Payer: Self-pay | Admitting: Oncology

## 2016-10-03 ENCOUNTER — Other Ambulatory Visit: Payer: 59

## 2016-10-03 DIAGNOSIS — C649 Malignant neoplasm of unspecified kidney, except renal pelvis: Secondary | ICD-10-CM

## 2016-10-03 NOTE — Telephone Encounter (Signed)
FAXED RECORDS TO [facility]

## 2016-10-04 ENCOUNTER — Encounter: Payer: Self-pay | Admitting: *Deleted

## 2016-10-04 ENCOUNTER — Encounter: Payer: Self-pay | Admitting: Oncology

## 2016-10-04 NOTE — Progress Notes (Signed)
Called pt to introduce myself as his Arboriculturist and to discuss copay assistance w/ [employer].  Pt informed me that he has met his deductible and out of pocket for the year so his ins should pay at 100% but he will probably need assistance in the beginning of the year when his ins renews.  I will meet him on 10/07/16 to give him my card to contact me in Dec 2018 to apply for copay assistance for Opdivo.  I also asked if he needed assistance w/ personal bills while going through treatment.  He said as of today, he doesn't need additional assistance but will contact me if anything changes.

## 2016-10-04 NOTE — Progress Notes (Signed)
Lm with darlena, in managed care. Scan scheduled for this week, has not been set up, patient calling for update.

## 2016-10-05 ENCOUNTER — Telehealth: Payer: Self-pay | Admitting: *Deleted

## 2016-10-05 ENCOUNTER — Ambulatory Visit (HOSPITAL_COMMUNITY)
Admission: RE | Admit: 2016-10-05 | Discharge: 2016-10-05 | Disposition: A | Discharge status: Home / Self Care | Payer: 59 | Source: Ambulatory Visit | Attending: Oncology | Admitting: Oncology

## 2016-10-05 DIAGNOSIS — Z7189 Other specified counseling: Secondary | ICD-10-CM | POA: Insufficient documentation

## 2016-10-05 DIAGNOSIS — C649 Malignant neoplasm of unspecified kidney, except renal pelvis: Secondary | ICD-10-CM | POA: Diagnosis present

## 2016-10-05 MED ORDER — GADOBENATE DIMEGLUMINE 529 MG/ML IV SOLN
20.0000 mL | Freq: Once | INTRAVENOUS | Status: AC
  Administered 2016-10-05: 20 mL via INTRAVENOUS

## 2016-10-05 NOTE — Telephone Encounter (Signed)
Spoke with patient, let him know that his scan was normal.

## 2016-10-05 NOTE — Telephone Encounter (Signed)
-----   Message from Wyatt Portela, MD sent at 10/05/2016 11:14 AM EDT ----- Please let him know MRI is normal.

## 2016-10-07 ENCOUNTER — Other Ambulatory Visit (HOSPITAL_BASED_OUTPATIENT_CLINIC_OR_DEPARTMENT_OTHER): Payer: 59

## 2016-10-07 ENCOUNTER — Ambulatory Visit (HOSPITAL_BASED_OUTPATIENT_CLINIC_OR_DEPARTMENT_OTHER): Payer: 59

## 2016-10-07 VITALS — BP 134/99 | HR 79 | Temp 98.1°F | Resp 18

## 2016-10-07 DIAGNOSIS — C649 Malignant neoplasm of unspecified kidney, except renal pelvis: Secondary | ICD-10-CM

## 2016-10-07 DIAGNOSIS — C641 Malignant neoplasm of right kidney, except renal pelvis: Secondary | ICD-10-CM

## 2016-10-07 DIAGNOSIS — Z7189 Other specified counseling: Secondary | ICD-10-CM

## 2016-10-07 DIAGNOSIS — Z5112 Encounter for antineoplastic immunotherapy: Secondary | ICD-10-CM

## 2016-10-07 LAB — COMPREHENSIVE METABOLIC PANEL
ALT: 24 U/L (ref 0–55)
AST: 20 U/L (ref 5–34)
Albumin: 4.3 g/dL (ref 3.5–5.0)
Alkaline Phosphatase: 66 U/L (ref 40–150)
Anion Gap: 10 mEq/L (ref 3–11)
BUN: 15.9 mg/dL (ref 7.0–26.0)
CALCIUM: 9.5 mg/dL (ref 8.4–10.4)
CHLORIDE: 107 meq/L (ref 98–109)
CO2: 22 meq/L (ref 22–29)
CREATININE: 1.5 mg/dL — AB (ref 0.7–1.3)
EGFR: 56 mL/min/{1.73_m2} — ABNORMAL LOW (ref >90)
Glucose: 84 mg/dl (ref 70–140)
Potassium: 4 mEq/L (ref 3.5–5.1)
Sodium: 139 mEq/L (ref 136–145)
Total Bilirubin: 0.55 mg/dL (ref 0.20–1.20)
Total Protein: 8 g/dL (ref 6.4–8.3)

## 2016-10-07 LAB — CBC WITH DIFFERENTIAL/PLATELET
BASO%: 1.2 % (ref 0.0–2.0)
Basophils Absolute: 0.1 10*3/uL (ref 0.0–0.1)
EOS%: 2.5 % (ref 0.0–7.0)
Eosinophils Absolute: 0.2 10*3/uL (ref 0.0–0.5)
HEMATOCRIT: 43.4 % (ref 38.4–49.9)
HGB: 14.9 g/dL (ref 13.0–17.1)
LYMPH#: 2.3 10*3/uL (ref 0.9–3.3)
LYMPH%: 37.7 % (ref 14.0–49.0)
MCH: 30.7 pg (ref 27.2–33.4)
MCHC: 34.3 g/dL (ref 32.0–36.0)
MCV: 89.5 fL (ref 79.3–98.0)
MONO#: 0.5 10*3/uL (ref 0.1–0.9)
MONO%: 7.7 % (ref 0.0–14.0)
NEUT#: 3.2 10*3/uL (ref 1.5–6.5)
NEUT%: 50.9 % (ref 39.0–75.0)
Platelets: 294 10*3/uL (ref 140–400)
RBC: 4.84 10*6/uL (ref 4.20–5.82)
RDW: 14.4 % (ref 11.0–14.6)
WBC: 6.2 10*3/uL (ref 4.0–10.3)

## 2016-10-07 MED ORDER — SODIUM CHLORIDE 0.9 % IV SOLN
1.0000 mg/kg | Freq: Once | INTRAVENOUS | Status: AC
  Administered 2016-10-07: 115 mg via INTRAVENOUS
  Filled 2016-10-07: qty 23

## 2016-10-07 MED ORDER — SODIUM CHLORIDE 0.9 % IV SOLN
340.0000 mg | Freq: Once | INTRAVENOUS | Status: AC
  Administered 2016-10-07: 340 mg via INTRAVENOUS
  Filled 2016-10-07: qty 30

## 2016-10-07 MED ORDER — SODIUM CHLORIDE 0.9 % IV SOLN
Freq: Once | INTRAVENOUS | Status: AC
  Administered 2016-10-07: 14:00:00 via INTRAVENOUS

## 2016-10-07 NOTE — Patient Instructions (Addendum)
[facility] Discharge Instructions for Patients Receiving Chemotherapy  Today you received the following chemotherapy agents :  Nivolumab,  Yervoy.  To help prevent nausea and vomiting after your treatment, we encourage you to take your nausea medication as prescribed.   If you develop nausea and vomiting that is not controlled by your nausea medication, call the clinic.   BELOW ARE SYMPTOMS THAT SHOULD BE REPORTED IMMEDIATELY:  *FEVER GREATER THAN 100.5 F  *CHILLS WITH OR WITHOUT FEVER  NAUSEA AND VOMITING THAT IS NOT CONTROLLED WITH YOUR NAUSEA MEDICATION  *UNUSUAL SHORTNESS OF BREATH  *UNUSUAL BRUISING OR BLEEDING  TENDERNESS IN MOUTH AND THROAT WITH OR WITHOUT PRESENCE OF ULCERS  *URINARY PROBLEMS  *BOWEL PROBLEMS  UNUSUAL RASH Items with * indicate a potential emergency and should be followed up as soon as possible.  Feel free to call the clinic should you have any questions or concerns. The clinic phone number is [phone].  Please show the CHEMO ALERT CARD at check-in to the Emergency Department and triage nurse.   Ipilimumab injection What is this medicine? IPILIMUMAB (IP i LIM ue mab) is a monoclonal antibody. It is used to treat melanoma, a type of skin cancer. This medicine may be used for other purposes; ask your health care provider or pharmacist if you have questions. COMMON BRAND NAME(S): YERVOY What should I tell my health care provider before I take this medicine? They need to know if you have any of these conditions: -Addison's disease -blood in your stools (black or tarry stools) or if you have blood in your vomit -eye disease, vision problems -history of pancreatitis -history of stomach bleeding -immune system problems -inflammatory bowel disease -kidney disease -liver disease -lupus -myasthenia gravis -organ transplant -rheumatoid arthritis -sarcoidosis -stomach or intestine problems -thyroid disease -tingling of the  fingers or toes, or other nerve disorder -an unusual or allergic reaction to ipilimumab, other medicines, foods, dyes, or preservatives -pregnant or trying to get pregnant -breast-feeding How should I use this medicine? This medicine is for infusion into a vein. It is given by a health care professional in a hospital or clinic setting. A special MedGuide will be given to you before each treatment. Be sure to read this information carefully each time. Talk to your pediatrician regarding the use of this medicine in children. While this drug may be prescribed for children as young as 12 years for selected conditions, precautions do apply. Overdosage: If you think you have taken too much of this medicine contact a poison control center or emergency room at once. NOTE: This medicine is only for you. Do not share this medicine with others. What if I miss a dose? It is important not to miss your dose. Call your doctor or health care professional if you are unable to keep an appointment. What may interact with this medicine? Interactions are not expected. This list may not describe all possible interactions. Give your health care provider a list of all the medicines, herbs, non-prescription drugs, or dietary supplements you use. Also tell them if you smoke, drink alcohol, or use illegal drugs. Some items may interact with your medicine. What should I watch for while using this medicine? Tell your doctor or healthcare professional if your symptoms do not start to get better or if they get worse. Do not become pregnant while taking this medicine or for 3 months after stopping it. Women should inform their doctor if they wish to become pregnant or think they might be   pregnant. There is a potential for serious side effects to an unborn child. Talk to your health care professional or pharmacist for more information. Do not breast-feed an infant while taking this medicine or for 3 months after the last  dose. Your condition will be monitored carefully while you are receiving this medicine. You may need blood work done while you are taking this medicine. What side effects may I notice from receiving this medicine? Side effects that you should report to your doctor or health care professional as soon as possible: -allergic reactions like skin rash, itching or hives, swelling of the face, lips, or tongue -black, tarry stools -bloody or watery diarrhea -changes in vision -dizziness -eye pain -fast, irregular heartbeat -feeling anxious -feeling faint or lightheaded, falls -nausea, vomiting -pain, tingling, numbness in the hands or feet -redness, blistering, peeling or loosening of the skin, including inside the mouth -signs and symptoms of liver injury like dark yellow or brown urine; general ill feeling or flu-like symptoms; light-colored stools; loss of appetite; nausea; right upper belly pain; unusually weak or tired; yellowing of the eyes or skin -unusual bleeding or bruising Side effects that usually do not require medical attention (report to your doctor or health care professional if they continue or are bothersome): -headache -loss of appetite -trouble sleeping This list may not describe all possible side effects. Call your doctor for medical advice about side effects. You may report side effects to FDA at 1-800-FDA-1088. Where should I keep my medicine? This drug is given in a hospital or clinic and will not be stored at home. NOTE: This sheet is a summary. It may not cover all possible information. If you have questions about this medicine, talk to your doctor, pharmacist, or health care provider.  2018 Elsevier/Gold Standard (2015-08-04 11:41:46)   Nivolumab injection What is this medicine? NIVOLUMAB (nye VOL ue mab) is a monoclonal antibody. It is used to treat melanoma, lung cancer, kidney cancer, head and neck cancer, Hodgkin lymphoma, urothelial cancer, colon cancer, and  liver cancer. This medicine may be used for other purposes; ask your health care provider or pharmacist if you have questions. COMMON BRAND NAME(S): Opdivo What should I tell my health care provider before I take this medicine? They need to know if you have any of these conditions: -diabetes -immune system problems -kidney disease -liver disease -lung disease -organ transplant -stomach or intestine problems -thyroid disease -an unusual or allergic reaction to nivolumab, other medicines, foods, dyes, or preservatives -pregnant or trying to get pregnant -breast-feeding How should I use this medicine? This medicine is for infusion into a vein. It is given by a health care professional in a hospital or clinic setting. A special MedGuide will be given to you before each treatment. Be sure to read this information carefully each time. Talk to your pediatrician regarding the use of this medicine in children. While this drug may be prescribed for children as young as 12 years for selected conditions, precautions do apply. Overdosage: If you think you have taken too much of this medicine contact a poison control center or emergency room at once. NOTE: This medicine is only for you. Do not share this medicine with others. What if I miss a dose? It is important not to miss your dose. Call your doctor or health care professional if you are unable to keep an appointment. What may interact with this medicine? Interactions have not been studied. Give your health care provider a list of all the medicines,   herbs, non-prescription drugs, or dietary supplements you use. Also tell them if you smoke, drink alcohol, or use illegal drugs. Some items may interact with your medicine. This list may not describe all possible interactions. Give your health care provider a list of all the medicines, herbs, non-prescription drugs, or dietary supplements you use. Also tell them if you smoke, drink alcohol, or use  illegal drugs. Some items may interact with your medicine. What should I watch for while using this medicine? This drug may make you feel generally unwell. Continue your course of treatment even though you feel ill unless your doctor tells you to stop. You may need blood work done while you are taking this medicine. Do not become pregnant while taking this medicine or for 5 months after stopping it. Women should inform their doctor if they wish to become pregnant or think they might be pregnant. There is a potential for serious side effects to an unborn child. Talk to your health care professional or pharmacist for more information. Do not breast-feed an infant while taking this medicine. What side effects may I notice from receiving this medicine? Side effects that you should report to your doctor or health care professional as soon as possible: -allergic reactions like skin rash, itching or hives, swelling of the face, lips, or tongue -black, tarry stools -blood in the urine -bloody or watery diarrhea -changes in vision -change in sex drive -changes in emotions or moods -chest pain -confusion -cough -decreased appetite -diarrhea -facial flushing -feeling faint or lightheaded -fever, chills -hair loss -hallucination, loss of contact with reality -headache -irritable -joint pain -loss of memory -muscle pain -muscle weakness -seizures -shortness of breath -signs and symptoms of high blood sugar such as dizziness; dry mouth; dry skin; fruity breath; nausea; stomach pain; increased hunger or thirst; increased urination -signs and symptoms of kidney injury like trouble passing urine or change in the amount of urine -signs and symptoms of liver injury like dark yellow or brown urine; general ill feeling or flu-like symptoms; light-colored stools; loss of appetite; nausea; right upper belly pain; unusually weak or tired; yellowing of the eyes or skin -stiff neck -swelling of the ankles,  feet, hands -weight gain Side effects that usually do not require medical attention (report to your doctor or health care professional if they continue or are bothersome): -bone pain -constipation -tiredness -vomiting This list may not describe all possible side effects. Call your doctor for medical advice about side effects. You may report side effects to FDA at 1-800-FDA-1088. Where should I keep my medicine? This drug is given in a hospital or clinic and will not be stored at home. NOTE: This sheet is a summary. It may not cover all possible information. If you have questions about this medicine, talk to your doctor, pharmacist, or health care provider.  2018 Elsevier/Gold Standard (2015-10-05 17:49:34)  

## 2016-10-10 ENCOUNTER — Telehealth: Payer: Self-pay | Admitting: *Deleted

## 2016-10-10 NOTE — Telephone Encounter (Signed)
Called patient for first time chemotherapy follow up call. Patient stated,"I did fine. The only problem I had was some itching on my chest and arms. The on-call physician told me it was OK to take Benadryl and a baking soda bath if needed." Instructed patient to call if he had any concerns or questions. He verbalized understanding.

## 2016-10-10 NOTE — Telephone Encounter (Signed)
-----   Message from Arty Baumgartner, RN sent at 10/07/2016  3:13 PM EDT ----- Regarding: First time chemo/ shadad First time nivolumab/yervoy. Tolerated well.  Dr. Alen Blew.

## 2016-10-11 ENCOUNTER — Encounter: Admission: RE | Admit: 2016-10-11 | Payer: 59 | Source: Ambulatory Visit

## 2016-10-11 ENCOUNTER — Telehealth: Payer: Self-pay | Admitting: *Deleted

## 2016-10-11 ENCOUNTER — Encounter
Admission: RE | Admit: 2016-10-11 | Discharge: 2016-10-11 | Disposition: A | Discharge status: Home / Self Care | Payer: 59 | Source: Ambulatory Visit | Attending: Oncology | Admitting: Oncology

## 2016-10-11 DIAGNOSIS — C649 Malignant neoplasm of unspecified kidney, except renal pelvis: Secondary | ICD-10-CM | POA: Diagnosis not present

## 2016-10-11 DIAGNOSIS — M25512 Pain in left shoulder: Secondary | ICD-10-CM | POA: Diagnosis not present

## 2016-10-11 DIAGNOSIS — M25551 Pain in right hip: Secondary | ICD-10-CM | POA: Diagnosis not present

## 2016-10-11 MED ORDER — TECHNETIUM TC 99M MEDRONATE IV KIT
25.0000 | PACK | Freq: Once | INTRAVENOUS | Status: AC | PRN
  Administered 2016-10-11: 22.99 via INTRAVENOUS

## 2016-10-11 NOTE — Telephone Encounter (Signed)
-----   Message from Wyatt Portela, MD sent at 10/11/2016  4:25 PM EDT ----- Please let him know his bone scan is normal.

## 2016-10-11 NOTE — Telephone Encounter (Signed)
As noted below by Dr. Alen Blew, I informed patient of his scan results. Patient verbalized understanding.

## 2016-10-14 ENCOUNTER — Ambulatory Visit: Payer: Self-pay

## 2016-10-14 ENCOUNTER — Ambulatory Visit (HOSPITAL_BASED_OUTPATIENT_CLINIC_OR_DEPARTMENT_OTHER): Payer: 59 | Admitting: Oncology

## 2016-10-14 ENCOUNTER — Telehealth: Payer: Self-pay | Admitting: Oncology

## 2016-10-14 ENCOUNTER — Other Ambulatory Visit: Payer: Self-pay

## 2016-10-14 VITALS — BP 114/88 | HR 116 | Temp 98.6°F | Resp 18 | Ht 74.0 in | Wt 258.3 lb

## 2016-10-14 DIAGNOSIS — L299 Pruritus, unspecified: Secondary | ICD-10-CM

## 2016-10-14 DIAGNOSIS — R911 Solitary pulmonary nodule: Secondary | ICD-10-CM

## 2016-10-14 DIAGNOSIS — R5383 Other fatigue: Secondary | ICD-10-CM

## 2016-10-14 DIAGNOSIS — C641 Malignant neoplasm of right kidney, except renal pelvis: Secondary | ICD-10-CM | POA: Diagnosis not present

## 2016-10-14 DIAGNOSIS — C649 Malignant neoplasm of unspecified kidney, except renal pelvis: Secondary | ICD-10-CM

## 2016-10-14 NOTE — Telephone Encounter (Signed)
Gave avs and calendar October and November

## 2016-10-14 NOTE — Progress Notes (Signed)
Hematology and Oncology Follow Up Visit  Shawn Estrada [id number] [birth year] 41 y.o. 10/14/2016 12:20 PM Avva, Steva Ready, Marcellina Millin, MD   Principle Diagnosis: 41 year old gentleman with renal cell carcinoma diagnosed in March 2018. He presented with a large kidney mass measuring 12 x 7.5 cm. He has pulmonary nodules suspicious for metastatic disease.   Prior Therapy: He is status post laparoscopic right radical nephrectomy completed on 03/31/2016 by Dr. Louis Meckel. 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 No sarcomatoid features noted and no positive margins. The final tumor size was 11 cm. A CT scan did show bilateral pulmonary nodules highly suspicious for malignancy.  Current therapy:  Ipilimumab/Nivolumab (ipi/nivo 1 mg/kg and 3 mg/kg) cycle 1 on 10/07/2016.  Interim History: Shawn Estrada presents today for a follow-up visit. Since the last visit, he received her first cycle of immunotherapy without complications. He did report some mild purulent rhinitis and nasal congestion and grade 1 fatigue. He denied any headaches, blurry vision. He does not report any neurological deficits. His appetite has been excellent and has gained weight. He does report abdominal soreness predominantly in the right side of the abdomen. No change in his bowel habits. He denied any diarrhea, hematochezia or melena.  He does not report any headaches, blurry vision, syncope or seizures. He does not report any fevers, chills, sweats or weight loss. He is not reporting any chest pain, palpitation, orthopnea or leg edema. He does not report any cough, wheezing or hemoptysis. He does not report any nausea, vomiting or abdominal pain. He does not report any arthralgias, myalgias. He does not report any skeletal complaints. He does not report any petechiae or excessive bleeding. He does not report any skin rash or ecchymosis. Remaining review of systems unremarkable.   Medications: I  have reviewed the patient's current medications.  Current Outpatient Prescriptions  Medication Sig Dispense Refill  . omeprazole (PRILOSEC) 20 MG capsule Take 20 mg by mouth daily.    . prochlorperazine (COMPAZINE) 10 MG tablet Take 1 tablet (10 mg total) by mouth every 6 (six) hours as needed for nausea or vomiting. 30 tablet 0   No current facility-administered medications for this visit.      Allergies:  Allergies  Allergen Reactions  . 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   Physical Exam: Blood pressure 114/88, pulse (!) 116, temperature 98.6 F (37 C), temperature source Oral, resp. rate 18, height 6\' 2"  (1.88 m), weight 258 lb 4.8 oz (117.2 kg), SpO2 100 %. ECOG: 0 General appearance: Well-appearing gentleman without distress. Head: Normocephalic, without obvious abnormality no oral thrush or ulcers. Neck: no adenopathy Lymph nodes: Cervical, supraclavicular, and axillary nodes normal. Heart:regular rate and rhythm, S1, S2 normal, no murmur, click, rub or gallop Lung:chest clear, no wheezing, rales, normal symmetric air entry. Abdomin: soft, non-tender, without masses or organomegaly no shifting dullness or ascites. EXT:no erythema, induration, or nodules   Lab Results: Lab Results  Component Value Date   WBC 6.2 10/07/2016   HGB 14.9 10/07/2016   HCT 43.4 10/07/2016   MCV 89.5 10/07/2016   PLT 294 10/07/2016     Chemistry      Component Value Date/Time   NA 139 10/07/2016 1203   K 4.0 10/07/2016 1203   CL 107 06/22/2016 1319   CL 105 12/15/2011 0601  CO2 22 10/07/2016 1203   BUN 15.9 10/07/2016 1203   CREATININE 1.5 (H) 10/07/2016 1203      Component Value Date/Time   CALCIUM 9.5 10/07/2016 1203   ALKPHOS 66 10/07/2016 1203   AST 20 10/07/2016 1203   ALT 24 10/07/2016 1203   BILITOT 0.55 10/07/2016 1203       EXAM: NUCLEAR MEDICINE WHOLE BODY BONE SCAN  TECHNIQUE: Whole body anterior and posterior images were obtained approximately 3 hours after intravenous injection of radiopharmaceutical.  RADIOPHARMACEUTICALS:  23.0 MCi Technetium-63m MDP IV  COMPARISON:  CT chest abdomen pelvis of 04/29/2016  FINDINGS: The radionuclide is noted throughout the skeleton with no abnormal focus of activity. The the activity in the shoulders appears symmetrical, the thoracolumbar spine is unremarkable, and both hips appear symmetrical and normal. No evidence of bone metastases is seen. The right kidney appears to have been resected previously.  IMPRESSION: No bone metastases.  EXAM: MRI HEAD WITHOUT AND WITH CONTRAST  TECHNIQUE: Multiplanar, multiecho pulse sequences of the brain and surrounding structures were obtained without and with intravenous contrast.  CONTRAST:  7mL MULTIHANCE GADOBENATE DIMEGLUMINE 529 MG/ML IV SOLN  COMPARISON:  04/29/2016  FINDINGS: Brain: No enhancement or swelling to suggest metastatic disease. Normal appearance of the brain. No infarct, hemorrhage, or hydrocephalus.  Vascular: Major vessels are patent  Skull and upper cervical spine: No evidence of marrow lesion.  Sinuses/Orbits: Small mucous retention cysts in the right maxillary sinus. Negative for orbital mass.  IMPRESSION: Negative brain MRI.  No metastatic disease.    41 year old gentleman with the following issues:  1. Renal cell carcinoma diagnosed in March 2018. He presented with a right kidney mass measuring close to 12 cm x 7.5 cm arising of the lower pole of the right kidney. He is status post laparoscopic right radical nephrectomy completed on 03/31/2016 by Dr. Louis Meckel. 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 No sarcomatoid features noted and no positive margins. The final tumor size was 11 cm. A CT scan did show bilateral pulmonary  nodules highly suspicious for malignancy.  CT scan obtained on 09/23/2016 showed evidence of recurrent disease in the pancreatic bed and possible suspicious metastasis to the adrenal gland as well as 11 x 9 mm in the pancreatic head that is suspicious for pancreatic metastasis.  He is currently receiving immunotherapy with ipi/nivo (1 mg/kg and 3 mg/kg). Cycle one given on 10/07/2016 I was well tolerated. He does report mild pruritus and grade 1 fatigue.  His bone scan and MRI obtained since the last visit were reviewed today and showed no evidence of metastatic disease.  The plan is to proceed with cycle 2 as scheduled on 10/28/2016.  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   3. Immune mediated complications: He will have thyroid function checked periodically. I have educated him about reporting any symptoms of diarrhea, cough or excessive fatigue as soon as possible. Pituitary abnormalities were also reviewed and is MRI did not show any pathology.  4. Follow-up: Will be on 10/26/2016 for a follow-up.  Zola Button MD 10/14/16

## 2016-10-26 ENCOUNTER — Ambulatory Visit (HOSPITAL_BASED_OUTPATIENT_CLINIC_OR_DEPARTMENT_OTHER): Payer: 59 | Admitting: Oncology

## 2016-10-26 ENCOUNTER — Telehealth: Payer: Self-pay | Admitting: Oncology

## 2016-10-26 ENCOUNTER — Ambulatory Visit: Payer: Self-pay

## 2016-10-26 ENCOUNTER — Other Ambulatory Visit (HOSPITAL_BASED_OUTPATIENT_CLINIC_OR_DEPARTMENT_OTHER): Payer: 59

## 2016-10-26 VITALS — BP 126/82 | HR 84 | Temp 98.6°F | Resp 18 | Ht 74.0 in | Wt 266.8 lb

## 2016-10-26 DIAGNOSIS — R0981 Nasal congestion: Secondary | ICD-10-CM | POA: Diagnosis not present

## 2016-10-26 DIAGNOSIS — R5383 Other fatigue: Secondary | ICD-10-CM

## 2016-10-26 DIAGNOSIS — R911 Solitary pulmonary nodule: Secondary | ICD-10-CM

## 2016-10-26 DIAGNOSIS — C641 Malignant neoplasm of right kidney, except renal pelvis: Secondary | ICD-10-CM | POA: Diagnosis not present

## 2016-10-26 DIAGNOSIS — C649 Malignant neoplasm of unspecified kidney, except renal pelvis: Secondary | ICD-10-CM

## 2016-10-26 DIAGNOSIS — J310 Chronic rhinitis: Secondary | ICD-10-CM

## 2016-10-26 DIAGNOSIS — Z7189 Other specified counseling: Secondary | ICD-10-CM

## 2016-10-26 LAB — CBC WITH DIFFERENTIAL/PLATELET
BASO%: 1 % (ref 0.0–2.0)
BASOS ABS: 0.1 10*3/uL (ref 0.0–0.1)
EOS ABS: 0.4 10*3/uL (ref 0.0–0.5)
EOS%: 6.1 % (ref 0.0–7.0)
HEMATOCRIT: 38.9 % (ref 38.4–49.9)
HEMOGLOBIN: 13.3 g/dL (ref 13.0–17.1)
LYMPH#: 1.7 10*3/uL (ref 0.9–3.3)
LYMPH%: 28.8 % (ref 14.0–49.0)
MCH: 30.6 pg (ref 27.2–33.4)
MCHC: 34.2 g/dL (ref 32.0–36.0)
MCV: 89.6 fL (ref 79.3–98.0)
MONO#: 0.4 10*3/uL (ref 0.1–0.9)
MONO%: 6.4 % (ref 0.0–14.0)
NEUT%: 57.7 % (ref 39.0–75.0)
NEUTROS ABS: 3.3 10*3/uL (ref 1.5–6.5)
PLATELETS: 263 10*3/uL (ref 140–400)
RBC: 4.34 10*6/uL (ref 4.20–5.82)
RDW: 13 % (ref 11.0–14.6)
WBC: 5.8 10*3/uL (ref 4.0–10.3)

## 2016-10-26 LAB — COMPREHENSIVE METABOLIC PANEL
ALBUMIN: 3.9 g/dL (ref 3.5–5.0)
ALK PHOS: 60 U/L (ref 40–150)
ALT: 29 U/L (ref 0–55)
ANION GAP: 10 meq/L (ref 3–11)
AST: 22 U/L (ref 5–34)
BUN: 18 mg/dL (ref 7.0–26.0)
CALCIUM: 9 mg/dL (ref 8.4–10.4)
CO2: 22 mEq/L (ref 22–29)
Chloride: 109 mEq/L (ref 98–109)
Creatinine: 1.5 mg/dL — ABNORMAL HIGH (ref 0.7–1.3)
EGFR: 54 mL/min/{1.73_m2} — AB (ref >60)
GLUCOSE: 95 mg/dL (ref 70–140)
POTASSIUM: 4.3 meq/L (ref 3.5–5.1)
SODIUM: 141 meq/L (ref 136–145)
Total Bilirubin: 0.64 mg/dL (ref 0.20–1.20)
Total Protein: 7.3 g/dL (ref 6.4–8.3)

## 2016-10-26 NOTE — Telephone Encounter (Signed)
Gave avs and calendar for November  °

## 2016-10-26 NOTE — Progress Notes (Signed)
Hematology and Oncology Follow Up Visit  Shawn Estrada [id number] 1975/06/24 41 y.o. 10/26/2016 9:02 AM Shawn Estrada, Shawn Millin, MD   Principle Diagnosis: 41 year old gentleman with renal cell carcinoma diagnosed in March 2018. He presented with a large kidney mass measuring 12 x 7.5 cm. He has pulmonary nodules suspicious for metastatic disease.   Prior Therapy: He is status post laparoscopic right radical nephrectomy completed on 03/31/2016 by Dr. Louis Meckel. 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 No sarcomatoid features noted and no positive margins. The final tumor size was 11 cm. A CT scan did show bilateral pulmonary nodules highly suspicious for malignancy.  Current therapy:  Ipilimumab/Nivolumab (ipi/nivo 1 mg/kg and 3 mg/kg) cycle 1 on 10/07/2016. He is here for evaluation before cycle 2 of therapy.   Interim History: [name] presents today for a follow-up visit. Since the last visit, he reports no recent complications or changes. Hereceived her first cycle of immunotherapy without delayed issues. He did report some mild rhinitis and nasal congestion and grade 1 fatigue. He denied any headaches, blurry vision. He does not report any neurological deficits. His appetite has been excellent and has gained weight. He denied any diarrhea or change in his bowel habits. He continues to work full time. He is dizziness and lightheadedness has improved.   He does not report any headaches, blurry vision, syncope or seizures. He does not report any fevers, chills, sweats or weight loss. He is not reporting any chest pain, palpitation, orthopnea or leg edema. He does not report any cough, wheezing or hemoptysis. He does not report any nausea, vomiting or abdominal pain. He does not report any arthralgias, myalgias. He does not report any skeletal complaints. He does not report any petechiae or excessive bleeding. He does not report any skin rash or  ecchymosis. Remaining review of systems unremarkable.   Medications: I have reviewed the patient's current medications.  Current Outpatient Prescriptions  Medication Sig Dispense Refill  . omeprazole (PRILOSEC) 20 MG capsule Take 20 mg by mouth daily.    . prochlorperazine (COMPAZINE) 10 MG tablet Take 1 tablet (10 mg total) by mouth every 6 (six) hours as needed for nausea or vomiting. 30 tablet 0   No current facility-administered medications for this visit.      Allergies:  Allergies  Allergen Reactions  . 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   Physical Exam: Blood pressure 126/82, pulse 84, temperature 98.6 F (37 C), temperature source Oral, resp. rate 18, height 6\' 2"  (1.88 m), weight 266 lb 12.8 oz (121 kg), SpO2 98 %. ECOG: 0 General appearance: alert, awake gentleman without distress. Head: Normocephalic, without obvious abnormality no oral thrush or ulcers. Neck: no adenopathy no masses or lesions. Lymph nodes: Cervical, supraclavicular, and axillary nodes normal. Heart:regular rate and rhythm, S1, S2 normal, no murmur, click, rub or gallop Lung:chest clear, no wheezing, rales, normal symmetric air entry. Abdomin: soft, non-tender, without masses or organomegaly no rebound or guarding. EXT:no erythema, induration, or nodules   Lab Results: Lab Results  Component Value Date   WBC 5.8 10/26/2016   HGB 13.3 10/26/2016   HCT 38.9 10/26/2016   MCV 89.6 10/26/2016   PLT 263 10/26/2016     Chemistry      Component Value Date/Time   NA 139 10/07/2016 1203  K 4.0 10/07/2016 1203   CL 107 06/22/2016 1319   CL 105 12/15/2011 0601   CO2 22 10/07/2016 1203   BUN 15.9 10/07/2016 1203   CREATININE 1.5 (H) 10/07/2016 1203      Component Value Date/Time   CALCIUM 9.5 10/07/2016 1203   ALKPHOS 66 10/07/2016 1203   AST 20  10/07/2016 1203   ALT 24 10/07/2016 1203   BILITOT 0.55 10/07/2016 3912       41 year old gentleman with the following issues:  1. Renal cell carcinoma diagnosed in March 2018. He presented with a right kidney mass measuring close to 12 cm x 7.5 cm arising of the lower pole of the right kidney. He is status post laparoscopic right radical nephrectomy completed on 03/31/2016 by Dr. Louis Meckel. 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 No sarcomatoid features noted and no positive margins. The final tumor size was 11 cm. A CT scan did show bilateral pulmonary nodules highly suspicious for malignancy.  CT scan obtained on 09/23/2016 showed evidence of recurrent disease in the pancreatic bed and possible suspicious metastasis to the adrenal gland as well as 11 x 9 mm in the pancreatic head that is suspicious for pancreatic metastasis.  His bone scan and MRI obtained on 10/05/2016 and 10/11/2016 showed no evidence of metastatic disease.   He is currently receiving immunotherapy with ipi/nivo (1 mg/kg and 3 mg/kg). Cycle one given on 10/07/2016. Therapy was well tolerated.    The plan is to proceed with cycle 2 as scheduled on 10/28/2016 without any dose reduction or delay.  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  3. Immune mediated complications: He will have thyroid function checked periodically. I continue toeducate him about reporting any symptoms of diarrhea, cough or excessive fatigue as soon as possible. He also notes report any neurological deficits or headaches.  4. Follow-up: we'll be on 10/28/2016 to receive his second cycle of immunotherapy. He'll have an M.D. Follow-up in 3 weeks for the third cycle of therapy.  Zola Button MD 10/26/16

## 2016-10-28 ENCOUNTER — Ambulatory Visit (HOSPITAL_BASED_OUTPATIENT_CLINIC_OR_DEPARTMENT_OTHER): Payer: 59 | Admitting: Medical

## 2016-10-28 ENCOUNTER — Ambulatory Visit (HOSPITAL_BASED_OUTPATIENT_CLINIC_OR_DEPARTMENT_OTHER): Payer: 59

## 2016-10-28 ENCOUNTER — Ambulatory Visit: Payer: Self-pay

## 2016-10-28 VITALS — BP 117/77 | HR 75 | Temp 98.4°F | Resp 18

## 2016-10-28 DIAGNOSIS — Z5112 Encounter for antineoplastic immunotherapy: Secondary | ICD-10-CM

## 2016-10-28 DIAGNOSIS — C649 Malignant neoplasm of unspecified kidney, except renal pelvis: Secondary | ICD-10-CM

## 2016-10-28 DIAGNOSIS — T8090XA Unspecified complication following infusion and therapeutic injection, initial encounter: Secondary | ICD-10-CM | POA: Diagnosis not present

## 2016-10-28 DIAGNOSIS — R911 Solitary pulmonary nodule: Secondary | ICD-10-CM

## 2016-10-28 DIAGNOSIS — C641 Malignant neoplasm of right kidney, except renal pelvis: Secondary | ICD-10-CM

## 2016-10-28 MED ORDER — SODIUM CHLORIDE 0.9 % IV SOLN
Freq: Once | INTRAVENOUS | Status: AC
  Administered 2016-10-28: 10:00:00 via INTRAVENOUS

## 2016-10-28 MED ORDER — FAMOTIDINE IN NACL 20-0.9 MG/50ML-% IV SOLN
20.0000 mg | Freq: Once | INTRAVENOUS | Status: AC | PRN
  Administered 2016-10-28: 20 mg via INTRAVENOUS

## 2016-10-28 MED ORDER — DIPHENHYDRAMINE HCL 50 MG/ML IJ SOLN
25.0000 mg | Freq: Once | INTRAMUSCULAR | Status: AC | PRN
  Administered 2016-10-28: 25 mg via INTRAVENOUS

## 2016-10-28 MED ORDER — SODIUM CHLORIDE 0.9 % IV SOLN
1.0000 mg/kg | Freq: Once | INTRAVENOUS | Status: AC
  Administered 2016-10-28: 115 mg via INTRAVENOUS
  Filled 2016-10-28: qty 23

## 2016-10-28 MED ORDER — SODIUM CHLORIDE 0.9 % IV SOLN
2.9000 mg/kg | Freq: Once | INTRAVENOUS | Status: AC
  Administered 2016-10-28: 340 mg via INTRAVENOUS
  Filled 2016-10-28: qty 30

## 2016-10-28 NOTE — Progress Notes (Signed)
[facility] Progress Note   DELYNN Estrada [id number] 07-01-1975 41 y.o.  Shawn Estrada is managed by Dr. Zola Button  Actively treated with chemotherapy: yes  Current Therapy: ipilimumab and nivolumab  Last Treated: 10 / 19 / 2018  Assessment: Plan:    Infusion reaction, initial encounter   Infusion reaction: The patient's nivolumab was stopped for around 15 minutes. He was given Benadryl 50 mg IV and Pepcid 20 mg IV with resolution of his symptoms. His nivolumab was restarted and the patient was then given an infusion of ipilimumab without any further reactions.  Please see After Visit Summary for patient specific instructions.  Future Appointments Date Time Provider Aneth  11/18/2016 11:00 AM [facility] LAB 1 [facility] None  11/18/2016 11:30 AM Wyatt Portela, MD [facility] None  11/18/2016 12:30 PM [facility] B7 [facility] None  12/09/2016 8:30 AM [facility] LAB 3 [facility] None  12/09/2016 9:00 AM Shadad, Mathis Dad, MD [facility] None  12/09/2016 10:00 AM [facility] D12 [facility] None    No orders of the defined types were placed in this encounter.      Subjective:   Patient ID:  Shawn Estrada is a 41 y.o. (DOB 02-18-1975) male.  Chief Complaint: No chief complaint on file.   HPI Shawn Estrada is a 41 year old male with a history of renal cell carcinoma with possible pulmonary metastasis who was being seen in the infusion room today for cycle 2 day 1 of ipilimumab and nivolumab. He was nearing completion of his infusion of nivolumab when he developed acute lower back pain and a nonproductive cough. The patient  Benadryl 50 mg IV and Pepcid 20 mg IV with resolution of his symptoms. He denied chest pain, chest tightness, shortness of breath, chills or diaphoresis.  Medications: I have reviewed the patient's current medications.  Allergies:  Allergies  Allergen Reactions  . Nsaids Other (See Comments)    Cant take nsaids  due to having only 1 kidney    Past Medical History:  Diagnosis Date  . Cancer ([place])    kidney  . Cancer determined by renal biopsy ([place])   . Complication of anesthesia    slow to wake up  . Depression   . GERD (gastroesophageal reflux disease)   . Hypertension    No meds prescribed at this time  . PONV (postoperative nausea and vomiting)   . Sleep apnea    cpap    Past Surgical History:  Procedure Laterality Date  . CHOLECYSTECTOMY    . LAPAROSCOPIC NEPHRECTOMY Right 03/31/2016   Procedure: RIGHT LAPAROSCOPIC RADICAL NEPHRECTOMY;  Surgeon: Ardis Hughs, MD;  Location: [facility];  Service: Urology;  Laterality: Right;    Family History  Problem Relation Age of Onset  . Depression Sister     Social History   Social History  . Marital status: Single    Spouse name: N/A  . Number of children: N/A  . Years of education: N/A   Occupational History  . Not on file.   Social History Main Topics  . Smoking status: Current Some Day Smoker    Packs/day: 0.25    Years: 26.00    Types: Cigarettes    Start date: 10/23/1989  . Smokeless tobacco: Current User    Types: Snuff     Comment: smokes about 10 cigarettes every 2 weeks.   . Alcohol use No     Comment: Occasional use  . Drug use: No  . Sexual activity: Yes  Partners: Female    Birth control/ protection: None, Condom   Other Topics Concern  . Not on file   Social History Narrative  . No narrative on file    Past Medical History, Surgical history, Social history, and Family history were reviewed and updated as appropriate.   Please see review of systems for further details on the patient's review from today.   Review of Systems:  Review of Systems  Constitutional: Negative for chills, diaphoresis and fever.  HENT: Negative for trouble swallowing.   Respiratory: Positive for cough. Negative for chest tightness and shortness of breath.   Cardiovascular: Negative for chest pain and palpitations.    Musculoskeletal: Positive for back pain.    Objective:   Physical Exam:  Vital Signs:  BP: 126 / 108 Pulse: 106 Temp: 98.4 SPO2: 100%  There were no vitals taken for this visit. ECOG: 0  Physical Exam  Constitutional: No distress.  HENT:  Head: Normocephalic and atraumatic.  Cardiovascular: Normal rate, regular rhythm and normal heart sounds.  Exam reveals no gallop and no friction rub.   No murmur heard. Pulmonary/Chest: Effort normal and breath sounds normal. No respiratory distress. He has no wheezes. He has no rales.  Musculoskeletal: He exhibits no edema.  Neurological: He is alert.  Skin: Skin is warm and dry. No rash noted. He is not diaphoretic. No erythema.  Psychiatric: He has a normal mood and affect. His behavior is normal. Judgment and thought content normal.    Lab Review:     Component Value Date/Time   NA 141 10/26/2016 0842   K 4.3 10/26/2016 0842   CL 107 06/22/2016 1319   CL 105 12/15/2011 0601   CO2 22 10/26/2016 0842   GLUCOSE 95 10/26/2016 0842   BUN 18.0 10/26/2016 0842   CREATININE 1.5 (H) 10/26/2016 0842   CALCIUM 9.0 10/26/2016 0842   PROT 7.3 10/26/2016 0842   ALBUMIN 3.9 10/26/2016 0842   AST 22 10/26/2016 0842   ALT 29 10/26/2016 0842   ALKPHOS 60 10/26/2016 0842   BILITOT 0.64 10/26/2016 0842   GFRNONAA 58 (L) 06/22/2016 1319   GFRNONAA >60 12/15/2011 0601   GFRAA >60 06/22/2016 1319   GFRAA >60 12/15/2011 0601       Component Value Date/Time   WBC 5.8 10/26/2016 0842   WBC 5.9 06/22/2016 1319   RBC 4.34 10/26/2016 0842   RBC 4.89 06/22/2016 1319   HGB 13.3 10/26/2016 0842   HCT 38.9 10/26/2016 0842   PLT 263 10/26/2016 0842   PLT 279 03/04/2016 1123   MCV 89.6 10/26/2016 0842   MCH 30.6 10/26/2016 0842   MCH 28.8 06/22/2016 1319   MCHC 34.2 10/26/2016 0842   MCHC 34.8 06/22/2016 1319   RDW 13.0 10/26/2016 0842   LYMPHSABS 1.7 10/26/2016 0842   MONOABS 0.4 10/26/2016 0842   EOSABS 0.4 10/26/2016 0842   EOSABS 0.2  03/04/2016 1123   EOSABS 0.3 12/26/2011 1151   BASOSABS 0.1 10/26/2016 0842   -------------------------------  Imaging from last 24 hours (if applicable):  Radiology interpretation: Mr Jeri Cos Wo Contrast  Result Date: 10/05/2016 CLINICAL DATA:  Kidney cancer, stage IV.  Staging. EXAM: MRI HEAD WITHOUT AND WITH CONTRAST TECHNIQUE: Multiplanar, multiecho pulse sequences of the brain and surrounding structures were obtained without and with intravenous contrast. CONTRAST:  41mL MULTIHANCE GADOBENATE DIMEGLUMINE 529 MG/ML IV SOLN COMPARISON:  04/29/2016 FINDINGS: Brain: No enhancement or swelling to suggest metastatic disease. Normal appearance of the brain. No infarct, hemorrhage,  or hydrocephalus. Vascular: Major vessels are patent Skull and upper cervical spine: No evidence of marrow lesion. Sinuses/Orbits: Small mucous retention cysts in the right maxillary sinus. Negative for orbital mass. IMPRESSION: Negative brain MRI.  No metastatic disease. Electronically Signed   By: Monte Fantasia M.D.   On: 10/05/2016 11:11   Nm Bone Scan Whole Body  Result Date: 10/11/2016 CLINICAL DATA:  History of renal cell carcinoma with metastasis, now with pain in the low back, right hip, and left shoulder EXAM: NUCLEAR MEDICINE WHOLE BODY BONE SCAN TECHNIQUE: Whole body anterior and posterior images were obtained approximately 3 hours after intravenous injection of radiopharmaceutical. RADIOPHARMACEUTICALS:  23.0 MCi Technetium-54m MDP IV COMPARISON:  CT chest abdomen pelvis of 04/29/2016 FINDINGS: The radionuclide is noted throughout the skeleton with no abnormal focus of activity. The the activity in the shoulders appears symmetrical, the thoracolumbar spine is unremarkable, and both hips appear symmetrical and normal. No evidence of bone metastases is seen. The right kidney appears to have been resected previously. IMPRESSION: No bone metastases. Electronically Signed   By: Ivar Drape M.D.   On: 10/11/2016 16:05

## 2016-10-28 NOTE — Progress Notes (Signed)
[facility] patient checklist completed.   During nivolumab infusion patient complains of cough and lower bilateral back pain. Infusion stopped,normal saline wide open, lungs clear upon auscultation.  Sandi Mealy, PA notified.   Sandi Mealy, PA notified. Patient reports back pain resolved. Order given and carried out for 25 mg Benadryl IV push and Famotidine 20 mg IVPB. Monitor patient for 15 minutes then restart infusion.   Patient cough has decreased in frequency.   Patient reports cough has resolved completely.  Infusion restarted.

## 2016-10-28 NOTE — Patient Instructions (Signed)
[facility] Discharge Instructions for Patients Receiving Chemotherapy  Today you received the following chemotherapy agents Opdivo/[facility] To help prevent nausea and vomiting after your treatment, we encourage you to take your nausea medication as prescribed.    If you develop nausea and vomiting that is not controlled by your nausea medication, call the clinic.   BELOW ARE SYMPTOMS THAT SHOULD BE REPORTED IMMEDIATELY:  *FEVER GREATER THAN 100.5 F  *CHILLS WITH OR WITHOUT FEVER  NAUSEA AND VOMITING THAT IS NOT CONTROLLED WITH YOUR NAUSEA MEDICATION  *UNUSUAL SHORTNESS OF BREATH  *UNUSUAL BRUISING OR BLEEDING  TENDERNESS IN MOUTH AND THROAT WITH OR WITHOUT PRESENCE OF ULCERS  *URINARY PROBLEMS  *BOWEL PROBLEMS  UNUSUAL RASH Items with * indicate a potential emergency and should be followed up as soon as possible.  Feel free to call the clinic should you have any questions or concerns. The clinic phone number is [phone].  Please show the [facility] at check-in to the Emergency Department and triage nurse.

## 2016-11-02 ENCOUNTER — Encounter: Payer: Self-pay | Admitting: *Deleted

## 2016-11-02 ENCOUNTER — Telehealth: Payer: Self-pay | Admitting: *Deleted

## 2016-11-02 NOTE — Telephone Encounter (Signed)
Patient calling to report diarrhea x 2 days.  Last nivolumab treatment was oct 19th. Per dr Alen Blew, he is to start imodium today. And increase hydration. We will call and check on him tomorrow. Patient verbalizes understanding.

## 2016-11-03 ENCOUNTER — Ambulatory Visit (HOSPITAL_BASED_OUTPATIENT_CLINIC_OR_DEPARTMENT_OTHER): Payer: 59 | Admitting: Oncology

## 2016-11-03 VITALS — BP 124/98 | HR 92 | Temp 98.8°F | Resp 20 | Ht 74.0 in | Wt 263.7 lb

## 2016-11-03 DIAGNOSIS — R194 Change in bowel habit: Secondary | ICD-10-CM

## 2016-11-03 DIAGNOSIS — C649 Malignant neoplasm of unspecified kidney, except renal pelvis: Secondary | ICD-10-CM

## 2016-11-03 DIAGNOSIS — C641 Malignant neoplasm of right kidney, except renal pelvis: Secondary | ICD-10-CM | POA: Diagnosis not present

## 2016-11-03 DIAGNOSIS — R5383 Other fatigue: Secondary | ICD-10-CM

## 2016-11-03 DIAGNOSIS — R911 Solitary pulmonary nodule: Secondary | ICD-10-CM | POA: Diagnosis not present

## 2016-11-03 NOTE — Progress Notes (Signed)
Hematology and Oncology Follow Up Visit  Shawn Estrada [id number] Jun 05, 1975 41 y.o. 11/03/2016 10:49 AM Avva, Steva Ready, Marcellina Millin, MD   Principle Diagnosis: 41 year old gentleman with renal cell carcinoma diagnosed in March 2018. He presented with a large kidney mass measuring 12 x 7.5 cm. He has pulmonary nodules suspicious for metastatic disease.   Prior Therapy: He is status post laparoscopic right radical nephrectomy completed on 03/31/2016 by Dr. Louis Meckel. 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 No sarcomatoid features noted and no positive margins. The final tumor size was 11 cm. A CT scan did show bilateral pulmonary nodules highly suspicious for malignancy.  Current therapy:  Ipilimumab/Nivolumab (ipi/nivo 1 mg/kg and 3 mg/kg) cycle 1 on 10/07/2016. He is here for evaluation after cycle 2 of therapy.   Interim History: Shawn Estrada presents today for a follow-up visit. Since the last visit, he received the second cycle of combined immune therapy with few complications.  He noted grade 1 fatigue as well as grade 1 GI toxicities.  He started developing loose bowel movements starting on Tuesday, November 01, 2016.  He had 3 loose bowel movements at the time.  On Wednesday, November 02, 2016 had close to 4 loose bowel movements.  He denies any fevers, abdominal pain or mucus in his stool.  He denied any hematochezia or melena.  He had no fevers, chills or recent antibiotic exposure.  He took Imodium and no further bowel movements on Wednesday.  This morning, he did have one loose bowel movement and feels reasonably well.  He did not go to work the last few days and continues to have mild fatigue.  He did report mild symptoms of cough and currently takes Benadryl twice a day for itching.  He denies any rash or shortness of breath.  He denies any hemoptysis or wheezing.  He does not report any headaches, blurry vision, syncope or seizures. He does not  report any fevers, chills, sweats or weight loss. He is not reporting any chest pain, palpitation, orthopnea or leg edema. He does not report any nausea, vomiting or abdominal pain. He does not report any arthralgias, myalgias. He does not report any skeletal complaints. He does not report any petechiae or excessive bleeding. He does not report any skin rash or ecchymosis. Remaining review of systems unremarkable.   Medications: I have reviewed the patient's current medications.  Current Outpatient Prescriptions  Medication Sig Dispense Refill  . Fexofenadine HCl (MUCINEX ALLERGY PO) Take 1 tablet by mouth as needed.    . Fluticasone Propionate (FLONASE NA) Place 1 spray into the nose as needed.    . Loratadine (CLARITIN) 10 MG CAPS Take 10 mg by mouth as needed.    Marland Kitchen omeprazole (PRILOSEC) 20 MG capsule Take 20 mg by mouth daily.    . prochlorperazine (COMPAZINE) 10 MG tablet Take 1 tablet (10 mg total) by mouth every 6 (six) hours as needed for nausea or vomiting. (Patient not taking: Reported on 10/26/2016) 30 tablet 0   No current facility-administered medications for this visit.      Allergies:  Allergies  Allergen Reactions  . 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   Physical Exam: Blood pressure (!) 124/98, pulse 92, temperature 98.8 F (37.1 C), temperature source Oral, resp. rate 20, height 6\' 2"  (1.88 m),  weight 263 lb 11.2 oz (119.6 kg), SpO2 97 %. ECOG: 0 General appearance: Well-appearing gentleman without distress. Head: Normocephalic, without obvious abnormality no oral ulcers or lesions. Neck: no adenopathy no masses or lesions. Lymph nodes: Cervical, supraclavicular, and axillary nodes normal. Heart:regular rate and rhythm, S1, S2 normal, no murmur, click, rub or gallop Lung:chest clear, no wheezing, rales, normal symmetric air  entry. Abdomin: soft, non-tender, without masses or organomegaly no shifting dullness or ascites.  No rebound or guarding. EXT:no erythema, induration, or nodules   Lab Results: Lab Results  Component Value Date   WBC 5.8 10/26/2016   HGB 13.3 10/26/2016   HCT 38.9 10/26/2016   MCV 89.6 10/26/2016   PLT 263 10/26/2016     Chemistry      Component Value Date/Time   NA 141 10/26/2016 0842   K 4.3 10/26/2016 0842   CL 107 06/22/2016 1319   CL 105 12/15/2011 0601   CO2 22 10/26/2016 0842   BUN 18.0 10/26/2016 0842   CREATININE 1.5 (H) 10/26/2016 0842      Component Value Date/Time   CALCIUM 9.0 10/26/2016 0842   ALKPHOS 60 10/26/2016 0842   AST 22 10/26/2016 0842   ALT 29 10/26/2016 0842   BILITOT 0.64 10/26/2016 4814       41 year old gentleman with the following issues:  1. Renal cell carcinoma diagnosed in March 2018. He presented with a right kidney mass measuring close to 12 cm x 7.5 cm arising of the lower pole of the right kidney. He is status post laparoscopic right radical nephrectomy completed on 03/31/2016 by Dr. Louis Meckel. 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 No sarcomatoid features noted and no positive margins. The final tumor size was 11 cm. A CT scan did show bilateral pulmonary nodules highly suspicious for malignancy.  CT scan obtained on 09/23/2016 showed evidence of recurrent disease in the pancreatic bed and possible suspicious metastasis to the adrenal gland as well as 11 x 9 mm in the pancreatic head that is suspicious for pancreatic metastasis.  His bone scan and MRI obtained on 10/05/2016 and 10/11/2016 showed no evidence of metastatic disease.   He is currently receiving immunotherapy with ipi/nivo (1 mg/kg and 3 mg/kg). Cycle one given on 10/07/2016. Therapy was well tolerated.   He is status post 2 cycles of therapy.  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  3.   GI toxicity: He has developed grade 1 toxicity without any evidence of worsening symptoms.  His symptoms are predominantly mild diarrhea with bowel movements of less than 5.  He has no evidence of infectious diarrhea and have used Imodium successfully.  I continue to counsel him about risk of colitis associated with immune therapy.  Given the fact that we are dealing with grade 1 toxicity, I see no reason for delaying or holding therapy.  He understands if he had develops worsening complications he might require delaying of treatment and possible prednisone therapy.  We will continue to monitor this closely with clear instructions to report any loose bowel movements or diarrhea in the next week or so.  3. Immune mediated complications: He will have thyroid function checked periodically.   4. Follow-up: Will be in 2 weeks for cycle 3 of therapy.  Zola Button MD 11/03/16

## 2016-11-07 DIAGNOSIS — C641 Malignant neoplasm of right kidney, except renal pelvis: Secondary | ICD-10-CM | POA: Diagnosis not present

## 2016-11-07 DIAGNOSIS — C7800 Secondary malignant neoplasm of unspecified lung: Secondary | ICD-10-CM | POA: Diagnosis not present

## 2016-11-17 ENCOUNTER — Telehealth: Payer: Self-pay | Admitting: Oncology

## 2016-11-17 ENCOUNTER — Encounter: Payer: Self-pay | Admitting: *Deleted

## 2016-11-17 NOTE — Telephone Encounter (Signed)
Patients mother called to cancel all future appointments. Patient will be going to a new cancer center. Appointments canceled.

## 2016-11-18 ENCOUNTER — Ambulatory Visit: Payer: Self-pay

## 2016-11-18 ENCOUNTER — Other Ambulatory Visit: Payer: Self-pay

## 2016-11-18 ENCOUNTER — Ambulatory Visit: Payer: Self-pay | Admitting: Oncology

## 2016-11-18 DIAGNOSIS — Z5112 Encounter for antineoplastic immunotherapy: Secondary | ICD-10-CM | POA: Diagnosis not present

## 2016-11-18 DIAGNOSIS — Z79899 Other long term (current) drug therapy: Secondary | ICD-10-CM | POA: Diagnosis not present

## 2016-11-18 DIAGNOSIS — R05 Cough: Secondary | ICD-10-CM | POA: Diagnosis not present

## 2016-11-18 DIAGNOSIS — Z85528 Personal history of other malignant neoplasm of kidney: Secondary | ICD-10-CM | POA: Diagnosis not present

## 2016-11-18 DIAGNOSIS — C7800 Secondary malignant neoplasm of unspecified lung: Secondary | ICD-10-CM | POA: Diagnosis not present

## 2016-11-18 DIAGNOSIS — C641 Malignant neoplasm of right kidney, except renal pelvis: Secondary | ICD-10-CM | POA: Diagnosis not present

## 2016-11-18 DIAGNOSIS — R918 Other nonspecific abnormal finding of lung field: Secondary | ICD-10-CM | POA: Diagnosis not present

## 2016-11-24 DIAGNOSIS — R030 Elevated blood-pressure reading, without diagnosis of hypertension: Secondary | ICD-10-CM | POA: Diagnosis not present

## 2016-11-24 DIAGNOSIS — K219 Gastro-esophageal reflux disease without esophagitis: Secondary | ICD-10-CM | POA: Diagnosis not present

## 2016-12-09 ENCOUNTER — Ambulatory Visit: Payer: Self-pay | Admitting: Oncology

## 2016-12-09 ENCOUNTER — Ambulatory Visit: Payer: Self-pay

## 2016-12-09 ENCOUNTER — Other Ambulatory Visit: Payer: Self-pay

## 2016-12-09 DIAGNOSIS — Z5112 Encounter for antineoplastic immunotherapy: Secondary | ICD-10-CM | POA: Diagnosis not present

## 2016-12-09 DIAGNOSIS — C641 Malignant neoplasm of right kidney, except renal pelvis: Secondary | ICD-10-CM | POA: Diagnosis not present

## 2016-12-09 DIAGNOSIS — C7800 Secondary malignant neoplasm of unspecified lung: Secondary | ICD-10-CM | POA: Diagnosis not present

## 2016-12-09 DIAGNOSIS — Z79899 Other long term (current) drug therapy: Secondary | ICD-10-CM | POA: Diagnosis not present

## 2016-12-28 DIAGNOSIS — R918 Other nonspecific abnormal finding of lung field: Secondary | ICD-10-CM | POA: Diagnosis not present

## 2016-12-28 DIAGNOSIS — C7800 Secondary malignant neoplasm of unspecified lung: Secondary | ICD-10-CM | POA: Diagnosis not present

## 2016-12-28 DIAGNOSIS — C641 Malignant neoplasm of right kidney, except renal pelvis: Secondary | ICD-10-CM | POA: Diagnosis not present

## 2016-12-28 DIAGNOSIS — Z905 Acquired absence of kidney: Secondary | ICD-10-CM | POA: Diagnosis not present

## 2017-01-18 DIAGNOSIS — R7989 Other specified abnormal findings of blood chemistry: Secondary | ICD-10-CM | POA: Diagnosis not present

## 2017-01-18 DIAGNOSIS — C7800 Secondary malignant neoplasm of unspecified lung: Secondary | ICD-10-CM | POA: Diagnosis not present

## 2017-01-18 DIAGNOSIS — C641 Malignant neoplasm of right kidney, except renal pelvis: Secondary | ICD-10-CM | POA: Diagnosis not present

## 2017-01-20 DIAGNOSIS — Z0000 Encounter for general adult medical examination without abnormal findings: Secondary | ICD-10-CM | POA: Diagnosis not present

## 2017-01-23 DIAGNOSIS — C641 Malignant neoplasm of right kidney, except renal pelvis: Secondary | ICD-10-CM | POA: Diagnosis not present

## 2017-01-23 DIAGNOSIS — E119 Type 2 diabetes mellitus without complications: Secondary | ICD-10-CM | POA: Diagnosis not present

## 2017-01-24 DIAGNOSIS — N183 Chronic kidney disease, stage 3 (moderate): Secondary | ICD-10-CM | POA: Diagnosis not present

## 2017-01-24 DIAGNOSIS — R030 Elevated blood-pressure reading, without diagnosis of hypertension: Secondary | ICD-10-CM | POA: Diagnosis not present

## 2017-01-24 DIAGNOSIS — E119 Type 2 diabetes mellitus without complications: Secondary | ICD-10-CM | POA: Diagnosis not present

## 2017-02-08 DIAGNOSIS — E2740 Unspecified adrenocortical insufficiency: Secondary | ICD-10-CM | POA: Diagnosis not present

## 2017-02-08 DIAGNOSIS — E119 Type 2 diabetes mellitus without complications: Secondary | ICD-10-CM | POA: Diagnosis not present

## 2017-02-08 DIAGNOSIS — R739 Hyperglycemia, unspecified: Secondary | ICD-10-CM | POA: Diagnosis not present

## 2017-02-08 DIAGNOSIS — C7800 Secondary malignant neoplasm of unspecified lung: Secondary | ICD-10-CM | POA: Diagnosis not present

## 2017-02-08 DIAGNOSIS — C641 Malignant neoplasm of right kidney, except renal pelvis: Secondary | ICD-10-CM | POA: Diagnosis not present

## 2017-02-08 DIAGNOSIS — Z9289 Personal history of other medical treatment: Secondary | ICD-10-CM | POA: Diagnosis not present

## 2017-02-09 DIAGNOSIS — C7801 Secondary malignant neoplasm of right lung: Secondary | ICD-10-CM | POA: Diagnosis not present

## 2017-02-09 DIAGNOSIS — E1010 Type 1 diabetes mellitus with ketoacidosis without coma: Secondary | ICD-10-CM | POA: Diagnosis not present

## 2017-02-09 DIAGNOSIS — E1065 Type 1 diabetes mellitus with hyperglycemia: Secondary | ICD-10-CM | POA: Diagnosis not present

## 2017-02-09 DIAGNOSIS — E236 Other disorders of pituitary gland: Secondary | ICD-10-CM | POA: Diagnosis not present

## 2017-02-09 DIAGNOSIS — C7972 Secondary malignant neoplasm of left adrenal gland: Secondary | ICD-10-CM | POA: Diagnosis not present

## 2017-02-09 DIAGNOSIS — C7802 Secondary malignant neoplasm of left lung: Secondary | ICD-10-CM | POA: Diagnosis not present

## 2017-02-09 DIAGNOSIS — C7800 Secondary malignant neoplasm of unspecified lung: Secondary | ICD-10-CM | POA: Diagnosis not present

## 2017-02-09 DIAGNOSIS — C641 Malignant neoplasm of right kidney, except renal pelvis: Secondary | ICD-10-CM | POA: Diagnosis not present

## 2017-02-09 DIAGNOSIS — Z9289 Personal history of other medical treatment: Secondary | ICD-10-CM | POA: Diagnosis not present

## 2017-02-09 DIAGNOSIS — E2749 Other adrenocortical insufficiency: Secondary | ICD-10-CM | POA: Diagnosis not present

## 2017-02-09 DIAGNOSIS — E108 Type 1 diabetes mellitus with unspecified complications: Secondary | ICD-10-CM | POA: Diagnosis not present

## 2017-02-09 DIAGNOSIS — E0910 Drug or chemical induced diabetes mellitus with ketoacidosis without coma: Secondary | ICD-10-CM | POA: Diagnosis not present

## 2017-02-16 DIAGNOSIS — E1010 Type 1 diabetes mellitus with ketoacidosis without coma: Secondary | ICD-10-CM | POA: Diagnosis not present

## 2017-02-21 DIAGNOSIS — C7800 Secondary malignant neoplasm of unspecified lung: Secondary | ICD-10-CM | POA: Diagnosis not present

## 2017-02-21 DIAGNOSIS — C641 Malignant neoplasm of right kidney, except renal pelvis: Secondary | ICD-10-CM | POA: Diagnosis not present

## 2017-02-21 DIAGNOSIS — E2749 Other adrenocortical insufficiency: Secondary | ICD-10-CM | POA: Diagnosis not present

## 2017-03-08 DIAGNOSIS — C641 Malignant neoplasm of right kidney, except renal pelvis: Secondary | ICD-10-CM | POA: Diagnosis not present

## 2017-03-08 DIAGNOSIS — C7800 Secondary malignant neoplasm of unspecified lung: Secondary | ICD-10-CM | POA: Diagnosis not present

## 2017-03-08 DIAGNOSIS — Z79899 Other long term (current) drug therapy: Secondary | ICD-10-CM | POA: Diagnosis not present

## 2017-03-13 DIAGNOSIS — R1084 Generalized abdominal pain: Secondary | ICD-10-CM | POA: Insufficient documentation

## 2017-03-13 DIAGNOSIS — Z85528 Personal history of other malignant neoplasm of kidney: Secondary | ICD-10-CM | POA: Diagnosis not present

## 2017-03-13 DIAGNOSIS — I10 Essential (primary) hypertension: Secondary | ICD-10-CM | POA: Diagnosis not present

## 2017-03-13 DIAGNOSIS — R1030 Lower abdominal pain, unspecified: Secondary | ICD-10-CM | POA: Diagnosis not present

## 2017-03-13 DIAGNOSIS — R110 Nausea: Secondary | ICD-10-CM | POA: Diagnosis not present

## 2017-03-13 DIAGNOSIS — F17210 Nicotine dependence, cigarettes, uncomplicated: Secondary | ICD-10-CM | POA: Insufficient documentation

## 2017-03-13 DIAGNOSIS — Z79899 Other long term (current) drug therapy: Secondary | ICD-10-CM | POA: Insufficient documentation

## 2017-03-13 LAB — COMPREHENSIVE METABOLIC PANEL
ALT: 24 U/L (ref 17–63)
ANION GAP: 14 (ref 5–15)
AST: 24 U/L (ref 15–41)
Albumin: 4.1 g/dL (ref 3.5–5.0)
Alkaline Phosphatase: 64 U/L (ref 38–126)
BILIRUBIN TOTAL: 0.9 mg/dL (ref 0.3–1.2)
BUN: 13 mg/dL (ref 6–20)
CALCIUM: 8.6 mg/dL — AB (ref 8.9–10.3)
CO2: 18 mmol/L — ABNORMAL LOW (ref 22–32)
Chloride: 99 mmol/L — ABNORMAL LOW (ref 101–111)
Creatinine, Ser: 1.2 mg/dL (ref 0.61–1.24)
GFR calc non Af Amer: >60 mL/min (ref >60)
Glucose, Bld: 129 mg/dL — ABNORMAL HIGH (ref 65–99)
Potassium: 3.2 mmol/L — ABNORMAL LOW (ref 3.5–5.1)
Sodium: 131 mmol/L — ABNORMAL LOW (ref 135–145)
TOTAL PROTEIN: 7.6 g/dL (ref 6.5–8.1)

## 2017-03-13 LAB — CBC WITH DIFFERENTIAL/PLATELET
BASOS ABS: 0.1 10*3/uL (ref 0–0.1)
BASOS PCT: 1 %
Eosinophils Absolute: 0.3 10*3/uL (ref 0–0.7)
Eosinophils Relative: 4 %
HEMATOCRIT: 43.3 % (ref 40.0–52.0)
HEMOGLOBIN: 14.9 g/dL (ref 13.0–18.0)
Lymphocytes Relative: 32 %
Lymphs Abs: 2.1 10*3/uL (ref 1.0–3.6)
MCH: 30.5 pg (ref 26.0–34.0)
MCHC: 34.3 g/dL (ref 32.0–36.0)
MCV: 88.9 fL (ref 80.0–100.0)
MONO ABS: 0.5 10*3/uL (ref 0.2–1.0)
Monocytes Relative: 7 %
NEUTROS ABS: 3.6 10*3/uL (ref 1.4–6.5)
NEUTROS PCT: 56 %
Platelets: 300 10*3/uL (ref 150–440)
RBC: 4.87 MIL/uL (ref 4.40–5.90)
RDW: 13.5 % (ref 11.5–14.5)
WBC: 6.5 10*3/uL (ref 3.8–10.6)

## 2017-03-13 LAB — LIPASE, BLOOD: Lipase: 49 U/L (ref 11–51)

## 2017-03-13 NOTE — ED Triage Notes (Signed)
Patient took oral chemo this AM.

## 2017-03-13 NOTE — ED Triage Notes (Signed)
Patient c/o lower abdominal pain. Patient reports severity was much more severe earlier today. Patient currently rates pain at 1 of 10. Patient has renal cancer with multiple metastases. Patient has had a right nephrectomy.

## 2017-03-14 ENCOUNTER — Emergency Department
Admission: EM | Admit: 2017-03-14 | Discharge: 2017-03-14 | Disposition: A | Discharge status: Home / Self Care | Payer: 59 | Attending: Emergency Medicine | Admitting: Emergency Medicine

## 2017-03-14 DIAGNOSIS — R110 Nausea: Secondary | ICD-10-CM

## 2017-03-14 DIAGNOSIS — R1084 Generalized abdominal pain: Secondary | ICD-10-CM

## 2017-03-14 MED ORDER — TAMSULOSIN HCL 0.4 MG PO CAPS: 0.4000 mg | ORAL_CAPSULE | Freq: Every day | ORAL | 0 refills | Status: DC

## 2017-03-14 MED ORDER — OXYCODONE-ACETAMINOPHEN 5-325 MG PO TABS: 2.0000 | ORAL_TABLET | ORAL | 0 refills | Status: DC | PRN

## 2017-03-14 MED ORDER — ONDANSETRON HCL 4 MG PO TABS: 4.0000 mg | ORAL_TABLET | Freq: Every day | ORAL | 1 refills | Status: DC | PRN

## 2017-03-14 NOTE — ED Provider Notes (Signed)
[facility] Emergency Department Provider Note   ____________________________________________   First MD Initiated Contact with Patient 03/14/17 0125     (approximate)  I have reviewed the triage vital signs and the nursing notes.   HISTORY  Chief Complaint Abdominal Pain    HPI Shawn Estrada is a 42 y.o. male who comes into the hospital today with some abdominal pain nausea and trouble breathing.  The patient states that he has had the abdominal pain before but normally it only last for a few seconds.  He states that this time it lasted for 30 minutes to an hour.  The pain was across his mid abdomen.  The patient did not have any vomiting or diarrhea.  He thinks that the shortness of breath may have been due to pain or anxiety but he was concerned so he decided to come in.  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  He called his oncologist and they told him to come into the hospital for evaluation.  The patient states that he did not take anything for pain.  He had taken a chemotherapy medication and in the morning but had not yet taken his evening dose.  He states though that currently his pain has subsided and is a 0 out of 10.  He also had some dizziness and weakness but again he states that that is not uncommon for him.  The patient states that he has a CT scan ordered for April and his last CT scan was recent.  He is here for evaluation.  Past Medical History:  Diagnosis Date  . Cancer ([place])    kidney  . Cancer determined by renal biopsy ([place])   . Complication of anesthesia    slow to wake up  . Depression   . GERD (gastroesophageal reflux disease)   . Hypertension    No meds prescribed at this time  . PONV (postoperative nausea and vomiting)   . Sleep apnea    cpap    Patient Active Problem List   Diagnosis Date Noted  . Kidney cancer, primary, with metastasis from kidney to other site [facility])  09/26/2016  . Goals of care, counseling/discussion 09/26/2016  . Primary malignant neoplasm of kidney with metastasis from kidney to other site [facility]) 09/26/2016  . Right renal mass 03/31/2016    Past Surgical History:  Procedure Laterality Date  . CHOLECYSTECTOMY    . LAPAROSCOPIC NEPHRECTOMY Right 03/31/2016   Procedure: RIGHT LAPAROSCOPIC RADICAL NEPHRECTOMY;  Surgeon: Ardis Hughs, MD;  Location: [facility] ORS;  Service: Urology;  Laterality: Right;    Prior to Admission medications   Medication Sig Start Date End Date Taking? Authorizing Provider  Fexofenadine HCl (MUCINEX ALLERGY PO) Take 1 tablet by mouth as needed.    [provider]  Fluticasone Propionate (FLONASE NA) Place 1 spray into the nose as needed.    [provider]  Loratadine (CLARITIN) 10 MG CAPS Take 10 mg by mouth as needed.    [provider]  omeprazole (PRILOSEC) 20 MG capsule Take 20 mg by mouth daily.    [provider]  ondansetron (ZOFRAN) 4 MG tablet Take 1 tablet (4 mg total) by mouth daily as needed for nausea or vomiting. 03/14/17 03/14/18  Loney Hering, MD  prochlorperazine (COMPAZINE) 10 MG tablet Take 1 tablet (10 mg total) by mouth every 6 (six) hours as needed for nausea or vomiting. Patient not  taking: Reported on 10/26/2016 09/26/16   Wyatt Portela, MD    Allergies Nsaids  Family History  Problem Relation Age of Onset  . Depression Sister     Social History Social History   Tobacco Use  . Smoking status: Current Some Day Smoker    Packs/day: 0.25    Years: 26.00    Pack years: 6.50    Types: Cigarettes    Start date: 10/23/1989  . Smokeless tobacco: Current User    Types: Snuff  . Tobacco comment: smokes about 10 cigarettes every 2 weeks.   Substance Use Topics  . Alcohol use: No    Alcohol/week: 0.0 - 4.2 oz    Comment: Occasional use  . Drug use: No    Review of Systems  Constitutional: No fever/chills Eyes: No visual changes. ENT:  No sore throat. Cardiovascular: Denies chest pain. Respiratory:  shortness of breath. Gastrointestinal:  abdominal pain.   nausea, no vomiting.  No diarrhea.  No constipation. Genitourinary: Negative for dysuria. Musculoskeletal: Negative for back pain. Skin: Negative for rash. Neurological: Dizzy and lightheaded   ____________________________________________   PHYSICAL EXAM:  VITAL SIGNS: ED Triage Vitals  Enc Vitals Group     BP 03/13/17 2242 (!) 142/94     Pulse Rate 03/13/17 2242 91     Resp 03/13/17 2242 17     Temp 03/13/17 2242 98 F (36.7 C)     Temp Source 03/13/17 2242 Oral     SpO2 03/13/17 2242 96 %     Weight 03/13/17 2243 252 lb (114.3 kg)     Height --      Head Circumference --      Peak Flow --      Pain Score 03/13/17 2243 1     Pain Loc --      Pain Edu? --      Excl. in [place]? --     Constitutional: Alert and oriented. Well appearing and in no distress. Eyes: Conjunctivae are normal. PERRL. EOMI. Head: Atraumatic. Nose: No congestion/rhinnorhea. Mouth/Throat: Mucous membranes are moist.  Oropharynx non-erythematous. Cardiovascular: Normal rate, regular rhythm. Grossly normal heart sounds.  Good peripheral circulation. Respiratory: Normal respiratory effort.  No retractions. Lungs CTAB. Gastrointestinal: Soft and nontender. No distention.  Positive bowel sounds Musculoskeletal: No lower extremity tenderness nor edema.   Neurologic:  Normal speech and language.  Skin:  Skin is warm, dry and intact.  Psychiatric: Mood and affect are normal.   ____________________________________________   LABS (all labs ordered are listed, but only abnormal results are displayed)  Labs Reviewed  COMPREHENSIVE METABOLIC PANEL - Abnormal; Notable for the following components:      Result Value   Sodium 131 (*)    Potassium 3.2 (*)    Chloride 99 (*)    CO2 18 (*)    Glucose, Bld 129 (*)    Calcium 8.6 (*)    All other components within normal limits  LIPASE,  BLOOD  CBC WITH DIFFERENTIAL/PLATELET   ____________________________________________  EKG  none ____________________________________________  RADIOLOGY  ED MD interpretation:  none  Official radiology report(s): No results found.  ____________________________________________   PROCEDURES  Procedure(s) performed: None  Procedures  Critical Care performed: No  ____________________________________________   INITIAL IMPRESSION / ASSESSMENT AND PLAN / ED COURSE  As part of my medical decision making, I reviewed the following data within the electronic MEDICAL RECORD NUMBER Notes from prior ED visits and [place] Controlled Substance Database   This is a 42 year old  male with a history of cancer who comes into the hospital today with some abdominal pain.  The patient's pain has since resolved.  I discussed with with the patient his previous pains and he states that nothing has ever been seen on CT.  He is been told that it could be due to the metastases that he has in his abdomen.  The patient also states that he has been told he could have some colitis.  Since his pain is improved though he does not feel it necessary to do any further imaging.  I discussed CT scan with the patient and he states that he already has one scheduled and will like to wait to receive it with his oncologist.  I did inform the patient that that would be appropriate as his white blood cell count lipase and CMP are negative.  The patient states that he will follow-up with his physician.  The patient feels well and will be discharged home.  He has no further complaints at this time.      ____________________________________________   FINAL CLINICAL IMPRESSION(S) / ED DIAGNOSES  Final diagnoses:  Generalized abdominal pain  Nausea     ED Discharge Orders        Ordered    oxyCODONE-acetaminophen (PERCOCET/ROXICET) 5-325 MG tablet  Every 4 hours PRN,   Status:  Discontinued     03/14/17 0334     tamsulosin (FLOMAX) 0.4 MG CAPS capsule  Daily,   Status:  Discontinued     03/14/17 0334    ondansetron (ZOFRAN) 4 MG tablet  Daily PRN     03/14/17 0334       Note:  This document was prepared using Dragon voice recognition software and may include unintentional dictation errors.    Loney Hering, MD 03/14/17 [phone]

## 2017-03-14 NOTE — Discharge Instructions (Signed)
Please follow-up with your oncologist and your primary care physician.  Please return with any worsening condition.  Please attempt taking some nausea medicine and Tylenol if this pain does return but if it is still unbearable and you cannot tolerate the pain please return to the emergency department.

## 2017-03-14 NOTE — ED Notes (Signed)
Family at bedside. 

## 2017-03-16 DIAGNOSIS — E1010 Type 1 diabetes mellitus with ketoacidosis without coma: Secondary | ICD-10-CM | POA: Diagnosis not present

## 2017-03-16 DIAGNOSIS — E109 Type 1 diabetes mellitus without complications: Secondary | ICD-10-CM | POA: Diagnosis not present

## 2017-03-16 DIAGNOSIS — Z9289 Personal history of other medical treatment: Secondary | ICD-10-CM | POA: Diagnosis not present

## 2017-04-06 DIAGNOSIS — E109 Type 1 diabetes mellitus without complications: Secondary | ICD-10-CM | POA: Diagnosis not present

## 2017-04-12 DIAGNOSIS — C7800 Secondary malignant neoplasm of unspecified lung: Secondary | ICD-10-CM | POA: Diagnosis not present

## 2017-04-12 DIAGNOSIS — C641 Malignant neoplasm of right kidney, except renal pelvis: Secondary | ICD-10-CM | POA: Diagnosis not present

## 2017-04-12 DIAGNOSIS — C7972 Secondary malignant neoplasm of left adrenal gland: Secondary | ICD-10-CM | POA: Diagnosis not present

## 2017-04-12 DIAGNOSIS — Z79899 Other long term (current) drug therapy: Secondary | ICD-10-CM | POA: Diagnosis not present

## 2017-04-12 DIAGNOSIS — C786 Secondary malignant neoplasm of retroperitoneum and peritoneum: Secondary | ICD-10-CM | POA: Diagnosis not present

## 2017-04-12 DIAGNOSIS — Z905 Acquired absence of kidney: Secondary | ICD-10-CM | POA: Diagnosis not present

## 2017-04-14 DIAGNOSIS — E119 Type 2 diabetes mellitus without complications: Secondary | ICD-10-CM | POA: Diagnosis not present

## 2017-04-14 DIAGNOSIS — Z1389 Encounter for screening for other disorder: Secondary | ICD-10-CM | POA: Diagnosis not present

## 2017-04-14 DIAGNOSIS — E2749 Other adrenocortical insufficiency: Secondary | ICD-10-CM | POA: Diagnosis not present

## 2017-04-14 DIAGNOSIS — E079 Disorder of thyroid, unspecified: Secondary | ICD-10-CM | POA: Diagnosis not present

## 2017-04-19 DIAGNOSIS — R531 Weakness: Secondary | ICD-10-CM | POA: Diagnosis not present

## 2017-04-19 DIAGNOSIS — E1010 Type 1 diabetes mellitus with ketoacidosis without coma: Secondary | ICD-10-CM | POA: Diagnosis not present

## 2017-04-19 DIAGNOSIS — E2749 Other adrenocortical insufficiency: Secondary | ICD-10-CM | POA: Diagnosis not present

## 2017-04-24 DIAGNOSIS — R6889 Other general symptoms and signs: Secondary | ICD-10-CM | POA: Diagnosis not present

## 2017-05-03 DIAGNOSIS — Z79899 Other long term (current) drug therapy: Secondary | ICD-10-CM | POA: Diagnosis not present

## 2017-05-03 DIAGNOSIS — C641 Malignant neoplasm of right kidney, except renal pelvis: Secondary | ICD-10-CM | POA: Diagnosis not present

## 2017-05-03 DIAGNOSIS — R7989 Other specified abnormal findings of blood chemistry: Secondary | ICD-10-CM | POA: Diagnosis not present

## 2017-05-03 DIAGNOSIS — E2749 Other adrenocortical insufficiency: Secondary | ICD-10-CM | POA: Diagnosis not present

## 2017-05-03 DIAGNOSIS — C7800 Secondary malignant neoplasm of unspecified lung: Secondary | ICD-10-CM | POA: Diagnosis not present

## 2017-05-19 DIAGNOSIS — C786 Secondary malignant neoplasm of retroperitoneum and peritoneum: Secondary | ICD-10-CM | POA: Diagnosis not present

## 2017-05-19 DIAGNOSIS — R918 Other nonspecific abnormal finding of lung field: Secondary | ICD-10-CM | POA: Diagnosis not present

## 2017-05-19 DIAGNOSIS — C641 Malignant neoplasm of right kidney, except renal pelvis: Secondary | ICD-10-CM | POA: Diagnosis not present

## 2017-05-22 DIAGNOSIS — C7800 Secondary malignant neoplasm of unspecified lung: Secondary | ICD-10-CM | POA: Diagnosis not present

## 2017-05-22 DIAGNOSIS — E109 Type 1 diabetes mellitus without complications: Secondary | ICD-10-CM | POA: Diagnosis not present

## 2017-05-22 DIAGNOSIS — C641 Malignant neoplasm of right kidney, except renal pelvis: Secondary | ICD-10-CM | POA: Diagnosis not present

## 2017-06-08 DIAGNOSIS — Z9289 Personal history of other medical treatment: Secondary | ICD-10-CM | POA: Diagnosis not present

## 2017-06-08 DIAGNOSIS — E1010 Type 1 diabetes mellitus with ketoacidosis without coma: Secondary | ICD-10-CM | POA: Diagnosis not present

## 2017-06-08 DIAGNOSIS — E2749 Other adrenocortical insufficiency: Secondary | ICD-10-CM | POA: Diagnosis not present

## 2017-06-28 ENCOUNTER — Other Ambulatory Visit: Payer: Self-pay

## 2017-06-28 ENCOUNTER — Emergency Department: Payer: 59

## 2017-06-28 ENCOUNTER — Emergency Department
Admission: EM | Admit: 2017-06-28 | Discharge: 2017-06-28 | Disposition: A | Discharge status: Home / Self Care | Payer: 59 | Attending: Emergency Medicine | Admitting: Emergency Medicine

## 2017-06-28 ENCOUNTER — Encounter: Payer: Self-pay | Admitting: Emergency Medicine

## 2017-06-28 DIAGNOSIS — I10 Essential (primary) hypertension: Secondary | ICD-10-CM | POA: Diagnosis not present

## 2017-06-28 DIAGNOSIS — R1110 Vomiting, unspecified: Secondary | ICD-10-CM | POA: Diagnosis present

## 2017-06-28 DIAGNOSIS — F17210 Nicotine dependence, cigarettes, uncomplicated: Secondary | ICD-10-CM | POA: Insufficient documentation

## 2017-06-28 DIAGNOSIS — R079 Chest pain, unspecified: Secondary | ICD-10-CM | POA: Diagnosis not present

## 2017-06-28 DIAGNOSIS — R112 Nausea with vomiting, unspecified: Secondary | ICD-10-CM | POA: Diagnosis not present

## 2017-06-28 DIAGNOSIS — Z794 Long term (current) use of insulin: Secondary | ICD-10-CM | POA: Insufficient documentation

## 2017-06-28 DIAGNOSIS — K209 Esophagitis, unspecified without bleeding: Secondary | ICD-10-CM

## 2017-06-28 DIAGNOSIS — C649 Malignant neoplasm of unspecified kidney, except renal pelvis: Secondary | ICD-10-CM | POA: Insufficient documentation

## 2017-06-28 DIAGNOSIS — Z79899 Other long term (current) drug therapy: Secondary | ICD-10-CM | POA: Insufficient documentation

## 2017-06-28 LAB — CBC WITH DIFFERENTIAL/PLATELET
BASOS ABS: 0.1 10*3/uL (ref 0–0.1)
Basophils Relative: 1 %
Eosinophils Absolute: 0.2 10*3/uL (ref 0–0.7)
Eosinophils Relative: 3 %
HEMATOCRIT: 46.7 % (ref 40.0–52.0)
HEMOGLOBIN: 16.7 g/dL (ref 13.0–18.0)
LYMPHS PCT: 40 %
Lymphs Abs: 3 10*3/uL (ref 1.0–3.6)
MCH: 31.8 pg (ref 26.0–34.0)
MCHC: 35.8 g/dL (ref 32.0–36.0)
MCV: 88.8 fL (ref 80.0–100.0)
MONO ABS: 0.4 10*3/uL (ref 0.2–1.0)
Monocytes Relative: 6 %
NEUTROS ABS: 3.8 10*3/uL (ref 1.4–6.5)
Neutrophils Relative %: 50 %
Platelets: 360 10*3/uL (ref 150–440)
RBC: 5.26 MIL/uL (ref 4.40–5.90)
RDW: 13.6 % (ref 11.5–14.5)
WBC: 7.5 10*3/uL (ref 3.8–10.6)

## 2017-06-28 LAB — URINALYSIS, ROUTINE W REFLEX MICROSCOPIC
BILIRUBIN URINE: NEGATIVE
GLUCOSE, UA: NEGATIVE mg/dL
Hgb urine dipstick: NEGATIVE
KETONES UR: 5 mg/dL — AB
Leukocytes, UA: NEGATIVE
Nitrite: NEGATIVE
PH: 8 (ref 5.0–8.0)
Protein, ur: NEGATIVE mg/dL
SPECIFIC GRAVITY, URINE: 1.011 (ref 1.005–1.030)

## 2017-06-28 LAB — TYPE AND SCREEN
ABO/RH(D): A POS
Antibody Screen: NEGATIVE

## 2017-06-28 LAB — COMPREHENSIVE METABOLIC PANEL
ALBUMIN: 4.4 g/dL (ref 3.5–5.0)
ALT: 20 U/L (ref 17–63)
AST: 22 U/L (ref 15–41)
Alkaline Phosphatase: 67 U/L (ref 38–126)
Anion gap: 14 (ref 5–15)
BILIRUBIN TOTAL: 1 mg/dL (ref 0.3–1.2)
BUN: 11 mg/dL (ref 6–20)
CALCIUM: 9.8 mg/dL (ref 8.9–10.3)
CO2: 24 mmol/L (ref 22–32)
CREATININE: 1.22 mg/dL (ref 0.61–1.24)
Chloride: 97 mmol/L — ABNORMAL LOW (ref 101–111)
GFR calc Af Amer: >60 mL/min (ref >60)
GFR calc non Af Amer: >60 mL/min (ref >60)
GLUCOSE: 189 mg/dL — AB (ref 65–99)
Potassium: 3.2 mmol/L — ABNORMAL LOW (ref 3.5–5.1)
Sodium: 135 mmol/L (ref 135–145)
TOTAL PROTEIN: 8.4 g/dL — AB (ref 6.5–8.1)

## 2017-06-28 LAB — LACTIC ACID, PLASMA
Lactic Acid, Venous: 3.1 mmol/L (ref 0.5–1.9)
Lactic Acid, Venous: 1.6 mmol/L (ref 0.5–1.9)

## 2017-06-28 LAB — TROPONIN I

## 2017-06-28 LAB — PROTIME-INR
INR: 0.89
PROTHROMBIN TIME: 12 s (ref 11.4–15.2)

## 2017-06-28 LAB — MAGNESIUM: Magnesium: 2.1 mg/dL (ref 1.7–2.4)

## 2017-06-28 LAB — LIPASE, BLOOD: Lipase: 38 U/L (ref 11–51)

## 2017-06-28 MED ORDER — SODIUM CHLORIDE 0.9 % IV BOLUS
1000.0000 mL | Freq: Once | INTRAVENOUS | Status: AC
  Administered 2017-06-28: 1000 mL via INTRAVENOUS

## 2017-06-28 MED ORDER — GI COCKTAIL ~~LOC~~
30.0000 mL | Freq: Once | ORAL | Status: AC
  Administered 2017-06-28: 30 mL via ORAL
  Filled 2017-06-28: qty 30

## 2017-06-28 MED ORDER — ONDANSETRON HCL 4 MG/2ML IJ SOLN
4.0000 mg | Freq: Four times a day (QID) | INTRAMUSCULAR | Status: DC | PRN
  Administered 2017-06-28: 4 mg via INTRAVENOUS
  Filled 2017-06-28: qty 2

## 2017-06-28 MED ORDER — SUCRALFATE 1 G PO TABS: 1.0000 g | ORAL_TABLET | Freq: Three times a day (TID) | ORAL | 0 refills | Status: DC

## 2017-06-28 NOTE — ED Notes (Signed)
Date and time results received: 06/28/17 7:59 AM  (use smartphrase ".now" to insert current time)  Test: lactic acid Critical Value: 3.1  Name of Provider Notified: Dr. Lavonia Drafts

## 2017-06-28 NOTE — ED Notes (Signed)
Pt up to give urine sample 

## 2017-06-28 NOTE — ED Notes (Signed)
Pt alert and oriented X4, active, cooperative, pt in NAD. RR even and unlabored, color WNL.  Pt informed to return if any life threatening symptoms occur.  Discharge and followup instructions reviewed.  

## 2017-06-28 NOTE — ED Provider Notes (Signed)
[facility] Emergency Department Provider Note   ____________________________________________    I have reviewed the triage vital signs and the nursing notes.   HISTORY  Chief Complaint Emesis     HPI Shawn Estrada is a 42 y.o. male with a history of metastatic renal carcinoma on oral chemotherapy followed by [facility] oncology.  Patient reports that approximately 11 PM yesterday he developed a burning sensation in his chest with mild nausea and multiple episodes of vomiting.  He reports his last episode of vomitus was at 4:30 AM, reports he is feeling better currently but given his medical history came to the emergency department to "get checked out ".  Denies fevers or chills.  No abdominal pain.  No shortness of breath or pleurisy.  Does have a history of adrenal insufficiency   Past Medical History:  Diagnosis Date  . Cancer ([place])    kidney  . Cancer determined by renal biopsy ([place])   . Complication of anesthesia    slow to wake up  . Depression   . GERD (gastroesophageal reflux disease)   . Hypertension    No meds prescribed at this time  . PONV (postoperative nausea and vomiting)   . Sleep apnea    cpap    Patient Active Problem List   Diagnosis Date Noted  . Kidney cancer, primary, with metastasis from kidney to other site [facility]) 09/26/2016  . Goals of care, counseling/discussion 09/26/2016  . Primary malignant neoplasm of kidney with metastasis from kidney to other site [facility]) 09/26/2016  . Right renal mass 03/31/2016    Past Surgical History:  Procedure Laterality Date  . CHOLECYSTECTOMY    . LAPAROSCOPIC NEPHRECTOMY Right 03/31/2016   Procedure: RIGHT LAPAROSCOPIC RADICAL NEPHRECTOMY;  Surgeon: Ardis Hughs, MD;  Location: [facility] ORS;  Service: Urology;  Laterality: Right;    Prior to Admission medications   Medication Sig Start Date End Date Taking? Authorizing Provider  escitalopram (LEXAPRO) 5 MG tablet Take 5 mg by mouth  daily.   Yes [provider]  HUMALOG KWIKPEN 100 UNIT/ML KiwkPen Inject 20 Units into the skin 3 (three) times daily with meals.   Yes [provider]  hydrocortisone (CORTEF) 5 MG tablet Take 10-25 mg by mouth 2 (two) times daily. 25 mg in the morning and 10mg  in the evening   Yes [provider]  INLYTA 5 MG tablet Take 10 mg by mouth 2 (two) times daily.   Yes [provider]  losartan (COZAAR) 25 MG tablet Take 25 mg by mouth daily.   Yes [provider]  omeprazole (PRILOSEC) 20 MG capsule Take 20 mg by mouth daily.   Yes [provider]  TRESIBA FLEXTOUCH 100 UNIT/ML SOPN FlexTouch Pen Inject 48 Units into the skin daily.   Yes [provider]  TRULICITY 1.5 RC/7.8LF SOPN Inject 1.5 mg into the skin once a week.   Yes [provider]  ondansetron (ZOFRAN) 4 MG tablet Take 1 tablet (4 mg total) by mouth daily as needed for nausea or vomiting. Patient not taking: Reported on 06/28/2017 03/14/17 03/14/18  Loney Hering, MD  prochlorperazine (COMPAZINE) 10 MG tablet Take 1 tablet (10 mg total) by mouth every 6 (six) hours as needed for nausea or vomiting. Patient not taking: Reported on 10/26/2016 09/26/16   Wyatt Portela, MD  sucralfate (CARAFATE) 1 g tablet Take 1 tablet (1 g total) by mouth 4 (four) times daily -  with meals and  at bedtime for 14 days. 06/28/17 07/12/17  Lavonia Drafts, MD     Allergies Nsaids  Family History  Problem Relation Age of Onset  . Depression Sister     Social History Social History   Tobacco Use  . Smoking status: Current Some Day Smoker    Packs/day: 0.25    Years: 26.00    Pack years: 6.50    Types: Cigarettes    Start date: 10/23/1989  . Smokeless tobacco: Current User    Types: Snuff  . Tobacco comment: smokes about 10 cigarettes every 2 weeks.   Substance Use Topics  . Alcohol use: No    Alcohol/week: 0.0 - 4.2 oz    Comment: Occasional use  . Drug use: No    Review  of Systems  Constitutional: No fever/chills Eyes: No visual changes.  ENT: No sore throat. Cardiovascular: Denies chest pain. Respiratory: Denies shortness of breath. Gastrointestinal: No abdominal pain.  No nausea, no vomiting.   Genitourinary: Negative for dysuria. Musculoskeletal: Negative for back pain. Skin: Negative for rash. Neurological: Negative for headaches or weakness   ____________________________________________   PHYSICAL EXAM:  VITAL SIGNS: ED Triage Vitals  Enc Vitals Group     BP 06/28/17 0602 (!) 132/94     Pulse Rate 06/28/17 0602 92     Resp 06/28/17 0602 18     Temp 06/28/17 0602 97.8 F (36.6 C)     Temp Source 06/28/17 0602 Oral     SpO2 06/28/17 0602 97 %     Weight 06/28/17 0600 111.1 kg (245 lb)     Height 06/28/17 0600 1.905 m (6\' 3" )     Head Circumference --      Peak Flow --      Pain Score 06/28/17 0600 3     Pain Loc --      Pain Edu? --      Excl. in [place]? --     Constitutional: Alert and oriented.  Pleasant and interactive Eyes: Conjunctivae are normal.   Nose: No congestion/rhinnorhea. Mouth/Throat: Mucous membranes are moist.    Cardiovascular: Normal rate, regular rhythm. Grossly normal heart sounds.  Good peripheral circulation. Respiratory: Normal respiratory effort.  No retractions. Lungs CTAB. Gastrointestinal: Soft and nontender. No distention.  No CVA tenderness.  Musculoskeletal: No lower extremity tenderness nor edema.  Warm and well perfused Neurologic:  Normal speech and language. No gross focal neurologic deficits are appreciated.  Skin:  Skin is warm, dry and intact. No rash noted. Psychiatric: Mood and affect are normal. Speech and behavior are normal.  ____________________________________________   LABS (all labs ordered are listed, but only abnormal results are displayed)  Labs Reviewed  LACTIC ACID, PLASMA - Abnormal; Notable for the following components:      Result Value   Lactic Acid, Venous 3.1 (*)      All other components within normal limits  COMPREHENSIVE METABOLIC PANEL - Abnormal; Notable for the following components:   Potassium 3.2 (*)    Chloride 97 (*)    Glucose, Bld 189 (*)    Total Protein 8.4 (*)    All other components within normal limits  URINALYSIS, ROUTINE W REFLEX MICROSCOPIC - Abnormal; Notable for the following components:   Color, Urine YELLOW (*)    APPearance CLEAR (*)    Ketones, ur 5 (*)    All other components within normal limits  CBC WITH DIFFERENTIAL/PLATELET  LIPASE, BLOOD  MAGNESIUM  PROTIME-INR  TROPONIN I  LACTIC ACID, PLASMA  TYPE AND SCREEN   ____________________________________________  EKG  ED ECG REPORT I, Lavonia Drafts, the attending physician, personally viewed and interpreted this ECG.  Date: 06/28/2017  Rhythm: normal sinus rhythm QRS Axis: normal Intervals: normal ST/T Wave abnormalities: normal Narrative Interpretation: no evidence of acute ischemia  ____________________________________________  RADIOLOGY None ____________________________________________   PROCEDURES  Procedure(s) performed: No  Procedures   Critical Care performed: No ____________________________________________   INITIAL IMPRESSION / ASSESSMENT AND PLAN / ED COURSE  Pertinent labs & imaging results that were available during my care of the patient were reviewed by me and considered in my medical decision making (see chart for details).  Patient presents with burning vertically in the center of his chest, suspicious for esophagitis/GERD, less likely ACS especially given normal EKG.  Labs pending.  Will give GI cocktail, Zofran, IV fluids monitor and reevaluate  Patient had complete relief from GI cocktail although with temporary improvement.  Lactic acid elevated however doubt sepsis, afebrile, normal white blood cell count.  Repeat lactic acid normal.  This appears to be related to esophagitis and/or gastritis.  Overall he is feeling  significantly better.  Will trial Carafate and have the patient follow-up closely with PCP and GI, strict return precautions discussed with the patient    ____________________________________________   FINAL CLINICAL IMPRESSION(S) / ED DIAGNOSES  Final diagnoses:  Esophagitis        Note:  This document was prepared using Dragon voice recognition software and may include unintentional dictation errors.    Lavonia Drafts, MD 06/28/17 1114

## 2017-06-28 NOTE — ED Triage Notes (Addendum)
Patient ambulatory to triage with steady gait, without difficulty or distress noted; pt reports since last night having N/V, red in color; believes he is in adrenal crisis; st increase in his lower abd pain as well; pt with renal cell carcinoma with metastasis to lungs & abd

## 2017-07-07 DIAGNOSIS — E109 Type 1 diabetes mellitus without complications: Secondary | ICD-10-CM | POA: Diagnosis not present

## 2017-07-10 DIAGNOSIS — Z79899 Other long term (current) drug therapy: Secondary | ICD-10-CM | POA: Diagnosis not present

## 2017-07-10 DIAGNOSIS — C7800 Secondary malignant neoplasm of unspecified lung: Secondary | ICD-10-CM | POA: Diagnosis not present

## 2017-07-10 DIAGNOSIS — C641 Malignant neoplasm of right kidney, except renal pelvis: Secondary | ICD-10-CM | POA: Diagnosis not present

## 2017-07-11 DIAGNOSIS — E109 Type 1 diabetes mellitus without complications: Secondary | ICD-10-CM | POA: Diagnosis not present

## 2017-08-16 DIAGNOSIS — C7800 Secondary malignant neoplasm of unspecified lung: Secondary | ICD-10-CM | POA: Diagnosis not present

## 2017-08-16 DIAGNOSIS — Z79899 Other long term (current) drug therapy: Secondary | ICD-10-CM | POA: Diagnosis not present

## 2017-08-16 DIAGNOSIS — C7972 Secondary malignant neoplasm of left adrenal gland: Secondary | ICD-10-CM | POA: Diagnosis not present

## 2017-08-16 DIAGNOSIS — R918 Other nonspecific abnormal finding of lung field: Secondary | ICD-10-CM | POA: Diagnosis not present

## 2017-08-16 DIAGNOSIS — C641 Malignant neoplasm of right kidney, except renal pelvis: Secondary | ICD-10-CM | POA: Diagnosis not present

## 2017-08-31 DIAGNOSIS — T451X5A Adverse effect of antineoplastic and immunosuppressive drugs, initial encounter: Secondary | ICD-10-CM | POA: Diagnosis not present

## 2017-08-31 DIAGNOSIS — Z9289 Personal history of other medical treatment: Secondary | ICD-10-CM | POA: Diagnosis not present

## 2017-08-31 DIAGNOSIS — E1065 Type 1 diabetes mellitus with hyperglycemia: Secondary | ICD-10-CM | POA: Diagnosis not present

## 2017-08-31 DIAGNOSIS — E2749 Other adrenocortical insufficiency: Secondary | ICD-10-CM | POA: Diagnosis not present

## 2017-08-31 DIAGNOSIS — Z006 Encounter for examination for normal comparison and control in clinical research program: Secondary | ICD-10-CM | POA: Diagnosis not present

## 2017-08-31 DIAGNOSIS — E0965 Drug or chemical induced diabetes mellitus with hyperglycemia: Secondary | ICD-10-CM | POA: Diagnosis not present

## 2017-09-04 DIAGNOSIS — C7800 Secondary malignant neoplasm of unspecified lung: Secondary | ICD-10-CM | POA: Diagnosis not present

## 2017-09-04 DIAGNOSIS — Z006 Encounter for examination for normal comparison and control in clinical research program: Secondary | ICD-10-CM | POA: Diagnosis not present

## 2017-09-04 DIAGNOSIS — C641 Malignant neoplasm of right kidney, except renal pelvis: Secondary | ICD-10-CM | POA: Diagnosis not present

## 2017-09-04 DIAGNOSIS — C786 Secondary malignant neoplasm of retroperitoneum and peritoneum: Secondary | ICD-10-CM | POA: Diagnosis not present

## 2017-09-15 DIAGNOSIS — C7800 Secondary malignant neoplasm of unspecified lung: Secondary | ICD-10-CM | POA: Diagnosis not present

## 2017-09-15 DIAGNOSIS — C641 Malignant neoplasm of right kidney, except renal pelvis: Secondary | ICD-10-CM | POA: Diagnosis not present

## 2017-09-15 DIAGNOSIS — Z006 Encounter for examination for normal comparison and control in clinical research program: Secondary | ICD-10-CM | POA: Diagnosis not present

## 2017-09-15 DIAGNOSIS — C786 Secondary malignant neoplasm of retroperitoneum and peritoneum: Secondary | ICD-10-CM | POA: Diagnosis not present

## 2017-09-19 DIAGNOSIS — C642 Malignant neoplasm of left kidney, except renal pelvis: Secondary | ICD-10-CM | POA: Diagnosis not present

## 2017-10-02 DIAGNOSIS — C7800 Secondary malignant neoplasm of unspecified lung: Secondary | ICD-10-CM | POA: Diagnosis not present

## 2017-10-02 DIAGNOSIS — C786 Secondary malignant neoplasm of retroperitoneum and peritoneum: Secondary | ICD-10-CM | POA: Diagnosis not present

## 2017-10-02 DIAGNOSIS — R9431 Abnormal electrocardiogram [ECG] [EKG]: Secondary | ICD-10-CM | POA: Diagnosis not present

## 2017-10-02 DIAGNOSIS — Z006 Encounter for examination for normal comparison and control in clinical research program: Secondary | ICD-10-CM | POA: Diagnosis not present

## 2017-10-02 DIAGNOSIS — C641 Malignant neoplasm of right kidney, except renal pelvis: Secondary | ICD-10-CM | POA: Diagnosis not present

## 2017-10-17 DIAGNOSIS — E109 Type 1 diabetes mellitus without complications: Secondary | ICD-10-CM | POA: Diagnosis not present

## 2017-10-25 DIAGNOSIS — C641 Malignant neoplasm of right kidney, except renal pelvis: Secondary | ICD-10-CM | POA: Diagnosis not present

## 2017-10-25 DIAGNOSIS — R918 Other nonspecific abnormal finding of lung field: Secondary | ICD-10-CM | POA: Diagnosis not present

## 2017-10-25 DIAGNOSIS — C7972 Secondary malignant neoplasm of left adrenal gland: Secondary | ICD-10-CM | POA: Diagnosis not present

## 2017-10-25 DIAGNOSIS — Z006 Encounter for examination for normal comparison and control in clinical research program: Secondary | ICD-10-CM | POA: Diagnosis not present

## 2017-10-25 DIAGNOSIS — R9431 Abnormal electrocardiogram [ECG] [EKG]: Secondary | ICD-10-CM | POA: Diagnosis not present

## 2017-10-25 DIAGNOSIS — C786 Secondary malignant neoplasm of retroperitoneum and peritoneum: Secondary | ICD-10-CM | POA: Diagnosis not present

## 2017-11-22 DIAGNOSIS — Z23 Encounter for immunization: Secondary | ICD-10-CM | POA: Diagnosis not present

## 2017-11-22 DIAGNOSIS — Z006 Encounter for examination for normal comparison and control in clinical research program: Secondary | ICD-10-CM | POA: Diagnosis not present

## 2017-11-22 DIAGNOSIS — R9431 Abnormal electrocardiogram [ECG] [EKG]: Secondary | ICD-10-CM | POA: Diagnosis not present

## 2017-11-22 DIAGNOSIS — C7801 Secondary malignant neoplasm of right lung: Secondary | ICD-10-CM | POA: Diagnosis not present

## 2017-11-22 DIAGNOSIS — C641 Malignant neoplasm of right kidney, except renal pelvis: Secondary | ICD-10-CM | POA: Diagnosis not present

## 2017-11-24 ENCOUNTER — Other Ambulatory Visit: Payer: Self-pay

## 2017-11-24 ENCOUNTER — Emergency Department
Admission: EM | Admit: 2017-11-24 | Discharge: 2017-11-24 | Disposition: A | Discharge status: Home / Self Care | Payer: 59 | Attending: Emergency Medicine | Admitting: Emergency Medicine

## 2017-11-24 DIAGNOSIS — C7989 Secondary malignant neoplasm of other specified sites: Secondary | ICD-10-CM | POA: Insufficient documentation

## 2017-11-24 DIAGNOSIS — F17210 Nicotine dependence, cigarettes, uncomplicated: Secondary | ICD-10-CM | POA: Diagnosis not present

## 2017-11-24 DIAGNOSIS — Z794 Long term (current) use of insulin: Secondary | ICD-10-CM | POA: Insufficient documentation

## 2017-11-24 DIAGNOSIS — C649 Malignant neoplasm of unspecified kidney, except renal pelvis: Secondary | ICD-10-CM | POA: Diagnosis not present

## 2017-11-24 DIAGNOSIS — R197 Diarrhea, unspecified: Secondary | ICD-10-CM | POA: Diagnosis not present

## 2017-11-24 DIAGNOSIS — I10 Essential (primary) hypertension: Secondary | ICD-10-CM | POA: Insufficient documentation

## 2017-11-24 DIAGNOSIS — R112 Nausea with vomiting, unspecified: Secondary | ICD-10-CM | POA: Diagnosis not present

## 2017-11-24 DIAGNOSIS — Z79899 Other long term (current) drug therapy: Secondary | ICD-10-CM | POA: Diagnosis not present

## 2017-11-24 LAB — URINALYSIS, COMPLETE (UACMP) WITH MICROSCOPIC
BILIRUBIN URINE: NEGATIVE
GLUCOSE, UA: 150 mg/dL — AB
HGB URINE DIPSTICK: NEGATIVE
Ketones, ur: NEGATIVE mg/dL
LEUKOCYTES UA: NEGATIVE
NITRITE: NEGATIVE
Protein, ur: 30 mg/dL — AB
SPECIFIC GRAVITY, URINE: 1.028 (ref 1.005–1.030)
pH: 5 (ref 5.0–8.0)

## 2017-11-24 LAB — CBC WITH DIFFERENTIAL/PLATELET
ABS IMMATURE GRANULOCYTES: 0.05 10*3/uL (ref 0.00–0.07)
BASOS ABS: 0.1 10*3/uL (ref 0.0–0.1)
Basophils Relative: 0 %
EOS PCT: 2 %
Eosinophils Absolute: 0.2 10*3/uL (ref 0.0–0.5)
HEMATOCRIT: 48.1 % (ref 39.0–52.0)
HEMOGLOBIN: 17.4 g/dL — AB (ref 13.0–17.0)
Immature Granulocytes: 0 %
LYMPHS ABS: 2.7 10*3/uL (ref 0.7–4.0)
LYMPHS PCT: 24 %
MCH: 32.2 pg (ref 26.0–34.0)
MCHC: 36.2 g/dL — AB (ref 30.0–36.0)
MCV: 88.9 fL (ref 80.0–100.0)
MONO ABS: 0.9 10*3/uL (ref 0.1–1.0)
Monocytes Relative: 8 %
NEUTROS ABS: 7.5 10*3/uL (ref 1.7–7.7)
NRBC: 0 % (ref 0.0–0.2)
Neutrophils Relative %: 66 %
Platelets: 282 10*3/uL (ref 150–400)
RBC: 5.41 MIL/uL (ref 4.22–5.81)
RDW: 14.1 % (ref 11.5–15.5)
WBC: 11.3 10*3/uL — AB (ref 4.0–10.5)

## 2017-11-24 LAB — COMPREHENSIVE METABOLIC PANEL
ALK PHOS: 67 U/L (ref 38–126)
ALT: 66 U/L — AB (ref 0–44)
AST: 40 U/L (ref 15–41)
Albumin: 4.4 g/dL (ref 3.5–5.0)
Anion gap: 14 (ref 5–15)
BILIRUBIN TOTAL: 1 mg/dL (ref 0.3–1.2)
BUN: 14 mg/dL (ref 6–20)
CO2: 20 mmol/L — AB (ref 22–32)
CREATININE: 1.3 mg/dL — AB (ref 0.61–1.24)
Calcium: 9.4 mg/dL (ref 8.9–10.3)
Chloride: 103 mmol/L (ref 98–111)
GFR calc non Af Amer: >60 mL/min (ref >60)
Glucose, Bld: 170 mg/dL — ABNORMAL HIGH (ref 70–99)
Potassium: 3.6 mmol/L (ref 3.5–5.1)
Sodium: 137 mmol/L (ref 135–145)
Total Protein: 8.2 g/dL — ABNORMAL HIGH (ref 6.5–8.1)

## 2017-11-24 LAB — CG4 I-STAT (LACTIC ACID)
Lactic Acid, Venous: 2.16 mmol/L (ref 0.5–1.9)
Lactic Acid, Venous: 1.18 mmol/L (ref 0.5–1.9)

## 2017-11-24 LAB — PROTIME-INR
INR: 0.91
PROTHROMBIN TIME: 12.2 s (ref 11.4–15.2)

## 2017-11-24 MED ORDER — SODIUM CHLORIDE 0.9 % IV BOLUS
1000.0000 mL | Freq: Once | INTRAVENOUS | Status: AC
  Administered 2017-11-24: 1000 mL via INTRAVENOUS

## 2017-11-24 MED ORDER — ONDANSETRON HCL 4 MG/2ML IJ SOLN
4.0000 mg | Freq: Once | INTRAMUSCULAR | Status: AC | PRN
  Administered 2017-11-24: 4 mg via INTRAVENOUS
  Filled 2017-11-24: qty 2

## 2017-11-24 NOTE — ED Notes (Signed)
Dr. Burlene Arnt notified pt is possible sepsis pt and that I was unable to get temp during initial vital signs check.

## 2017-11-24 NOTE — ED Provider Notes (Addendum)
[facility] Emergency Department Provider Note  ____________________________________________   I have reviewed the triage vital signs and the nursing notes. Where available I have reviewed prior notes and, if possible and indicated, outside hospital notes.    HISTORY  Chief Complaint Emesis and Nausea    HPI Shawn Estrada is a 42 y.o. male who presents today complaining of nausea vomiting and diarrhea.  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  He restarted his chemotherapy last Thursday, Wednesday began have nausea vomiting diarrhea.  No fever.  No chest pain no abdominal pain, no lightheadedness.  No hematemesis no melena bright red blood per rectum, nothing makes it better nothing makes it worse, did try Imodium and Zofran on the way and is feeling a little bit better.  Really no focal abdominal pain.  This is happened to him before but not to the extent with his chemotherapy.    Past Medical History:  Diagnosis Date  . Cancer ([place])    kidney  . Cancer determined by renal biopsy ([place])   . Complication of anesthesia    slow to wake up  . Depression   . GERD (gastroesophageal reflux disease)   . Hypertension    No meds prescribed at this time  . PONV (postoperative nausea and vomiting)   . Sleep apnea    cpap    Patient Active Problem List   Diagnosis Date Noted  . Kidney cancer, primary, with metastasis from kidney to other site [facility]) 09/26/2016  . Goals of care, counseling/discussion 09/26/2016  . Primary malignant neoplasm of kidney with metastasis from kidney to other site [facility]) 09/26/2016  . Right renal mass 03/31/2016    Past Surgical History:  Procedure Laterality Date  . CHOLECYSTECTOMY    . LAPAROSCOPIC NEPHRECTOMY Right 03/31/2016   Procedure: RIGHT LAPAROSCOPIC RADICAL NEPHRECTOMY;  Surgeon: Ardis Hughs, MD;  Location: [facility];  Service: Urology;  Laterality: Right;    Prior to Admission  medications   Medication Sig Start Date End Date Taking? Authorizing Provider  escitalopram (LEXAPRO) 5 MG tablet Take 5 mg by mouth daily.    [provider]  HUMALOG KWIKPEN 100 UNIT/ML KiwkPen Inject 20 Units into the skin 3 (three) times daily with meals.    [provider]  hydrocortisone (CORTEF) 5 MG tablet Take 10-25 mg by mouth 2 (two) times daily. 25 mg in the morning and 10mg  in the evening    [provider]  INLYTA 5 MG tablet Take 10 mg by mouth 2 (two) times daily.    [provider]  losartan (COZAAR) 25 MG tablet Take 25 mg by mouth daily.    [provider]  omeprazole (PRILOSEC) 20 MG capsule Take 20 mg by mouth daily.    [provider]  ondansetron (ZOFRAN) 4 MG tablet Take 1 tablet (4 mg total) by mouth daily as needed for nausea or vomiting. Patient not taking: Reported on 06/28/2017 03/14/17 03/14/18  Loney Hering, MD  prochlorperazine (COMPAZINE) 10 MG tablet Take 1 tablet (10 mg total) by mouth every 6 (six) hours as needed for nausea or vomiting. Patient not taking: Reported on 10/26/2016 09/26/16   Wyatt Portela, MD  sucralfate (CARAFATE) 1 g tablet Take 1 tablet (1 g total) by mouth 4 (four) times daily -  with meals and at bedtime for 14 days. 06/28/17 07/12/17  Lavonia Drafts, MD  TRESIBA FLEXTOUCH 100 UNIT/ML SOPN FlexTouch Pen Inject 48  Units into the skin daily.    [provider]  TRULICITY 1.5 SN/0.5LZ SOPN Inject 1.5 mg into the skin once a week.    [provider]    Allergies Nivolumab; Lisinopril; Nsaids; and Tolmetin  Family History  Problem Relation Age of Onset  . Depression Sister     Social History Social History   Tobacco Use  . Smoking status: Current Some Day Smoker    Packs/day: 0.25    Years: 26.00    Pack years: 6.50    Types: Cigarettes    Start date: 10/23/1989  . Smokeless tobacco: Current User    Types: Snuff  . Tobacco comment: smokes about 10 cigarettes  every 2 weeks.   Substance Use Topics  . Alcohol use: No    Alcohol/week: 0.0 - 7.0 standard drinks    Comment: Occasional use  . Drug use: No    Review of Systems Constitutional: No fever/chills Eyes: No visual changes. ENT: No sore throat. No stiff neck no neck pain Cardiovascular: Denies chest pain. Respiratory: Denies shortness of breath. Gastrointestinal: Positive nausea and vomiting and diarrhea no constipation. Genitourinary: Negative for dysuria. Musculoskeletal: Negative lower extremity swelling Skin: Negative for rash. Neurological: Negative for severe headaches, focal weakness or numbness.   ____________________________________________   PHYSICAL EXAM:  VITAL SIGNS: ED Triage Vitals  Enc Vitals Group     BP 11/24/17 1348 100/88     Pulse Rate 11/24/17 1348 (!) 125     Resp 11/24/17 1348 20     Temp 11/24/17 1405 (!) 97.5 F (36.4 C)     Temp Source 11/24/17 1405 Oral     SpO2 11/24/17 1348 95 %     Weight 11/24/17 1348 255 lb (115.7 kg)     Height 11/24/17 1348 6\' 2"  (1.88 m)     Head Circumference --      Peak Flow --      Pain Score 11/24/17 1435 0     Pain Loc --      Pain Edu? --      Excl. in [place]? --     Constitutional: Alert and oriented. Well appearing and in no acute distress. Eyes: Conjunctivae are normal Head: Atraumatic HEENT: No congestion/rhinnorhea. Mucous membranes are moist.  Oropharynx non-erythematous Neck:   Nontender with no meningismus, no masses, no stridor Cardiovascular: Normal rate, regular rhythm. Grossly normal heart sounds.  Good peripheral circulation. Respiratory: Normal respiratory effort.  No retractions. Lungs CTAB. Abdominal: Soft and nontender. No distention. No guarding no rebound Back:  There is no focal tenderness or step off.  there is no midline tenderness there are no lesions noted. there is no CVA tenderness Musculoskeletal: No lower extremity tenderness, no upper extremity tenderness. No joint effusions, no  DVT signs strong distal pulses no edema Neurologic:  Normal speech and language. No gross focal neurologic deficits are appreciated.  Skin:  Skin is warm, dry and intact. No rash noted. Psychiatric: Mood and affect are normal. Speech and behavior are normal.  ____________________________________________   LABS (all labs ordered are listed, but only abnormal results are displayed)  Labs Reviewed  COMPREHENSIVE METABOLIC PANEL - Abnormal; Notable for the following components:      Result Value   CO2 20 (*)    Glucose, Bld 170 (*)    Creatinine, Ser 1.30 (*)    Total Protein 8.2 (*)    ALT 66 (*)    All other components within normal limits  CBC WITH DIFFERENTIAL/PLATELET -  Abnormal; Notable for the following components:   WBC 11.3 (*)    Hemoglobin 17.4 (*)    MCHC 36.2 (*)    All other components within normal limits  CG4 I-STAT (LACTIC ACID) - Abnormal; Notable for the following components:   Lactic Acid, Venous 2.16 (*)    All other components within normal limits  CULTURE, BLOOD (ROUTINE X 2)  CULTURE, BLOOD (ROUTINE X 2)  C DIFFICILE QUICK SCREEN W PCR REFLEX  GASTROINTESTINAL PANEL BY PCR, STOOL (REPLACES STOOL CULTURE)  PROTIME-INR  URINALYSIS, COMPLETE (UACMP) WITH MICROSCOPIC  I-STAT CG4 LACTIC ACID, ED  I-STAT CG4 LACTIC ACID, ED    Pertinent labs  results that were available during my care of the patient were reviewed by me and considered in my medical decision making (see chart for details). ____________________________________________  EKG  I personally interpreted any EKGs ordered by me or triage  ____________________________________________  RADIOLOGY  Pertinent labs & imaging results that were available during my care of the patient were reviewed by me and considered in my medical decision making (see chart for details). If possible, patient and/or family made aware of any abnormal findings.  No results  found. ____________________________________________    PROCEDURES  Procedure(s) performed: None  Procedures  Critical Care performed: None  ____________________________________________   INITIAL IMPRESSION / ASSESSMENT AND PLAN / ED COURSE  Pertinent labs & imaging results that were available during my care of the patient were reviewed by me and considered in my medical decision making (see chart for details).  She with nausea vomiting and diarrhea in the context of chemotherapy this could be a medication reaction, be a viral gastroenteritis could be bacterial gastroenteritis, we we will see if we can send a stool culture, the abdomen is non-surgical and nontender I do not think that he requires CT scan.  Lactic is borderline which is not unusual in cases of dehydration like this we will give him IV fluid, his strong preference is to go home, we will continue to hydrate him well here and we will reassess.  Blood cultures will be obtained because of his chemotherapy urinalysis is pending.  ----------------------------------------- 4:01 PM on 11/24/2017 -----------------------------------------  Patient feeling somewhat better, signed out at the end of my shift to dr. Corky Downs.    ____________________________________________   FINAL CLINICAL IMPRESSION(S) / ED DIAGNOSES  Final diagnoses:  None      This chart was dictated using voice recognition software.  Despite best efforts to proofread,  errors can occur which can change meaning.      Schuyler Amor, MD 11/24/17 1500    Schuyler Amor, MD 11/24/17 [phone]

## 2017-11-24 NOTE — ED Notes (Signed)
FIRST NURSE NOTE:  Pt has hx of stage IV CA, pt has been having n/v. Pt has adrenal insufficiency as well.  Pt placed in wheelchair.

## 2017-11-24 NOTE — ED Provider Notes (Signed)
Patient is feeling significantly better.  Repeat lactic acid has normalized.  He has Zofran at home.  Will follow up with his physician as needed.  Return precautions   Lavonia Drafts, MD 11/24/17 1715

## 2017-11-24 NOTE — ED Notes (Signed)
Date and time results received: 11/24/17 2:15 PM (use smartphrase ".now" to insert current time)  Test: Lactic Acid Critical Value: 2.16  Name of Provider Notified: Dr. Burlene Arnt  Orders Received? Or Actions Taken?: No new orders at this time.

## 2017-11-24 NOTE — ED Triage Notes (Signed)
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  Pt c/o some abdominal pain.

## 2017-11-29 LAB — CULTURE, BLOOD (ROUTINE X 2)
CULTURE: NO GROWTH
Culture: NO GROWTH
Special Requests: ADEQUATE

## 2017-12-05 ENCOUNTER — Other Ambulatory Visit: Payer: Self-pay

## 2017-12-05 ENCOUNTER — Encounter: Payer: Self-pay | Admitting: Emergency Medicine

## 2017-12-05 ENCOUNTER — Emergency Department
Admission: EM | Admit: 2017-12-05 | Discharge: 2017-12-05 | Disposition: A | Discharge status: Home / Self Care | Payer: 59 | Attending: Emergency Medicine | Admitting: Emergency Medicine

## 2017-12-05 DIAGNOSIS — E1165 Type 2 diabetes mellitus with hyperglycemia: Secondary | ICD-10-CM | POA: Diagnosis not present

## 2017-12-05 DIAGNOSIS — I10 Essential (primary) hypertension: Secondary | ICD-10-CM | POA: Diagnosis not present

## 2017-12-05 DIAGNOSIS — Z794 Long term (current) use of insulin: Secondary | ICD-10-CM | POA: Insufficient documentation

## 2017-12-05 DIAGNOSIS — Z79899 Other long term (current) drug therapy: Secondary | ICD-10-CM | POA: Diagnosis not present

## 2017-12-05 DIAGNOSIS — R739 Hyperglycemia, unspecified: Secondary | ICD-10-CM

## 2017-12-05 DIAGNOSIS — R5383 Other fatigue: Secondary | ICD-10-CM | POA: Insufficient documentation

## 2017-12-05 DIAGNOSIS — F17210 Nicotine dependence, cigarettes, uncomplicated: Secondary | ICD-10-CM | POA: Diagnosis not present

## 2017-12-05 LAB — URINALYSIS, COMPLETE (UACMP) WITH MICROSCOPIC
BILIRUBIN URINE: NEGATIVE
Bacteria, UA: NONE SEEN
HGB URINE DIPSTICK: NEGATIVE
KETONES UR: NEGATIVE mg/dL
LEUKOCYTES UA: NEGATIVE
NITRITE: NEGATIVE
Protein, ur: NEGATIVE mg/dL
Specific Gravity, Urine: 1.023 (ref 1.005–1.030)
pH: 6 (ref 5.0–8.0)

## 2017-12-05 LAB — BASIC METABOLIC PANEL
ANION GAP: 11 (ref 5–15)
BUN: 9 mg/dL (ref 6–20)
CO2: 24 mmol/L (ref 22–32)
Calcium: 8.6 mg/dL — ABNORMAL LOW (ref 8.9–10.3)
Chloride: 102 mmol/L (ref 98–111)
Creatinine, Ser: 1.19 mg/dL (ref 0.61–1.24)
GFR calc non Af Amer: >60 mL/min (ref >60)
Glucose, Bld: 314 mg/dL — ABNORMAL HIGH (ref 70–99)
POTASSIUM: 3.6 mmol/L (ref 3.5–5.1)
SODIUM: 137 mmol/L (ref 135–145)

## 2017-12-05 LAB — GLUCOSE, CAPILLARY: Glucose-Capillary: 283 mg/dL — ABNORMAL HIGH (ref 70–99)

## 2017-12-05 LAB — CBC
HCT: 42.3 % (ref 39.0–52.0)
HEMOGLOBIN: 15.3 g/dL (ref 13.0–17.0)
MCH: 32.6 pg (ref 26.0–34.0)
MCHC: 36.2 g/dL — ABNORMAL HIGH (ref 30.0–36.0)
MCV: 90 fL (ref 80.0–100.0)
NRBC: 0 % (ref 0.0–0.2)
PLATELETS: 261 10*3/uL (ref 150–400)
RBC: 4.7 MIL/uL (ref 4.22–5.81)
RDW: 14.4 % (ref 11.5–15.5)
WBC: 6.9 10*3/uL (ref 4.0–10.5)

## 2017-12-05 MED ORDER — SODIUM CHLORIDE 0.9 % IV BOLUS
1000.0000 mL | Freq: Once | INTRAVENOUS | Status: AC
  Administered 2017-12-05: 1000 mL via INTRAVENOUS

## 2017-12-05 NOTE — ED Provider Notes (Signed)
[facility] Emergency Department Provider Note  ____________________________________________   I have reviewed the triage vital signs and the nursing notes.   HISTORY  Chief Complaint Hyperglycemia   History limited by: Not Limited   HPI Shawn Estrada is a 42 y.o. male who presents to the emergency department today because of concern for possible DKA. The patient states that his sugars have been high for the past two weeks. He feels like he is having a hard time controlling them with his medication. Denies any recent change in his medication. In addition to his sugars being high he has also felt fatigued. He has been discussing with his oncologist about his fatigue and at this point apparently they do not think it is related to his chemotherapy.    Per medical record review patient has a history of renal cancer, DM  Past Medical History:  Diagnosis Date  . Cancer ([place])    kidney  . Cancer determined by renal biopsy ([place])   . Complication of anesthesia    slow to wake up  . Depression   . GERD (gastroesophageal reflux disease)   . Hypertension    No meds prescribed at this time  . PONV (postoperative nausea and vomiting)   . Sleep apnea    cpap    Patient Active Problem List   Diagnosis Date Noted  . Kidney cancer, primary, with metastasis from kidney to other site [facility]) 09/26/2016  . Goals of care, counseling/discussion 09/26/2016  . Primary malignant neoplasm of kidney with metastasis from kidney to other site [facility]) 09/26/2016  . Right renal mass 03/31/2016    Past Surgical History:  Procedure Laterality Date  . CHOLECYSTECTOMY    . LAPAROSCOPIC NEPHRECTOMY Right 03/31/2016   Procedure: RIGHT LAPAROSCOPIC RADICAL NEPHRECTOMY;  Surgeon: Ardis Hughs, MD;  Location: [facility];  Service: Urology;  Laterality: Right;    Prior to Admission medications   Medication Sig Start Date End Date Taking? Authorizing Provider  escitalopram (LEXAPRO)  5 MG tablet Take 5 mg by mouth daily.    [provider]  HUMALOG KWIKPEN 100 UNIT/ML KiwkPen Inject 20 Units into the skin 3 (three) times daily with meals.    [provider]  hydrocortisone (CORTEF) 5 MG tablet Take 10-25 mg by mouth 2 (two) times daily. 25 mg in the morning and 10mg  in the evening    [provider]  INLYTA 5 MG tablet Take 10 mg by mouth 2 (two) times daily.    [provider]  losartan (COZAAR) 25 MG tablet Take 25 mg by mouth daily.    [provider]  omeprazole (PRILOSEC) 20 MG capsule Take 20 mg by mouth daily.    [provider]  ondansetron (ZOFRAN) 4 MG tablet Take 1 tablet (4 mg total) by mouth daily as needed for nausea or vomiting. Patient not taking: Reported on 06/28/2017 03/14/17 03/14/18  Loney Hering, MD  prochlorperazine (COMPAZINE) 10 MG tablet Take 1 tablet (10 mg total) by mouth every 6 (six) hours as needed for nausea or vomiting. Patient not taking: Reported on 10/26/2016 09/26/16   Wyatt Portela, MD  sucralfate (CARAFATE) 1 g tablet Take 1 tablet (1 g total) by mouth 4 (four) times daily -  with meals and at bedtime for 14 days. 06/28/17 07/12/17  Lavonia Drafts, MD  TRESIBA FLEXTOUCH 100 UNIT/ML SOPN FlexTouch Pen Inject 48 Units into the skin daily.    [provider]  TRULICITY 1.5 TZ/0.0FV  SOPN Inject 1.5 mg into the skin once a week.    [provider]    Allergies Nivolumab; Lisinopril; Nsaids; and Tolmetin  Family History  Problem Relation Age of Onset  . Depression Sister     Social History Social History   Tobacco Use  . Smoking status: Current Some Day Smoker    Packs/day: 0.25    Years: 26.00    Pack years: 6.50    Types: Cigarettes    Start date: 10/23/1989  . Smokeless tobacco: Current User    Types: Snuff  . Tobacco comment: smokes about 10 cigarettes every 2 weeks.   Substance Use Topics  . Alcohol use: No    Alcohol/week: 0.0 - 7.0 standard drinks     Comment: Occasional use  . Drug use: No    Review of Systems Constitutional: No fever/chills. Positive for fatigue. Eyes: No visual changes. ENT: No sore throat. Cardiovascular: Denies chest pain. Respiratory: Denies shortness of breath. Gastrointestinal: No abdominal pain.  No nausea, no vomiting.  No diarrhea.   Genitourinary: Negative for dysuria. Musculoskeletal: Negative for back pain. Skin: Negative for rash. Neurological: Negative for headaches, focal weakness or numbness.  ____________________________________________   PHYSICAL EXAM:  VITAL SIGNS: ED Triage Vitals  Enc Vitals Group     BP 12/05/17 1656 (!) 123/94     Pulse Rate 12/05/17 1656 (!) 103     Resp 12/05/17 1656 16     Temp 12/05/17 1656 98 F (36.7 C)     Temp Source 12/05/17 1656 Oral     SpO2 12/05/17 1656 95 %     Weight 12/05/17 1657 250 lb (113.4 kg)     Height 12/05/17 1657 6\' 2"  (1.88 m)     Head Circumference --      Peak Flow --      Pain Score 12/05/17 1657 0   Constitutional: Alert and oriented.  Eyes: Conjunctivae are normal.  ENT      Head: Normocephalic and atraumatic.      Nose: No congestion/rhinnorhea.      Mouth/Throat: Mucous membranes are moist.      Neck: No stridor. Hematological/Lymphatic/Immunilogical: No cervical lymphadenopathy. Cardiovascular: Normal rate, regular rhythm.  No murmurs, rubs, or gallops.  Respiratory: Normal respiratory effort without tachypnea nor retractions. Breath sounds are clear and equal bilaterally. No wheezes/rales/rhonchi. Gastrointestinal: Soft and non tender. No rebound. No guarding.  Genitourinary: Deferred Musculoskeletal: Normal range of motion in all extremities. No lower extremity edema. Neurologic:  Normal speech and language. No gross focal neurologic deficits are appreciated.  Skin:  Skin is warm, dry and intact. No rash noted. Psychiatric: Mood and affect are normal. Speech and behavior are normal. Patient exhibits appropriate  insight and judgment.  ____________________________________________    LABS (pertinent positives/negatives)  CBC wbc 6.9, hgb 15.3, plt 261 UA clear, ketones negative BMP na 137, glu 314, cr 1.19, anion gap 11 ____________________________________________   EKG  None  ____________________________________________    RADIOLOGY  None  ____________________________________________   PROCEDURES  Procedures  ____________________________________________   INITIAL IMPRESSION / ASSESSMENT AND PLAN / ED COURSE  Pertinent labs & imaging results that were available during my care of the patient were reviewed by me and considered in my medical decision making (see chart for details).   Patient presented to the emergency department today because of concerns for possible DKA.  While the patient was hyperglycemic he did not have evidence of DKA.  No elevated anion gap.  No ketones in  urine.  He patient was given IV fluids here.  Appears his other complaint of fatigue is unclear etiology at this point.  Discussed with patient importance of continued work-up with oncology and endocrinology.   ____________________________________________   FINAL CLINICAL IMPRESSION(S) / ED DIAGNOSES  Final diagnoses:  Hyperglycemia  Fatigue, unspecified type     Note: This dictation was prepared with Dragon dictation. Any transcriptional errors that result from this process are unintentional     Nance Pear, MD 12/05/17 2000

## 2017-12-05 NOTE — ED Triage Notes (Signed)
Pt in via POV, sent over from endocrinologist.  Pt is type 1 diabetic, also w/ stage 4 kidney cancer currently receiving chemo.  Pt reports elevated blood sugars x 4 days, increased fatigue, nausea, diarrhea.  Pt with glucose monitor currently reading 400, endocrinologist concerned for DKA.  Vitals WDL at this time.

## 2017-12-05 NOTE — ED Notes (Signed)
Pt alert.  Iv fluids infusing.  Friend at bedside.

## 2017-12-05 NOTE — ED Notes (Signed)
Pt reports high blood sugar for past 3-4 days.  Pt is diabetic and and has stage 4 kidney cancer.  Pt states his doctor from [place] told him to come to the ER for eval.   No n/v/d  Pt denies any pain.  Iv in place. Pt alert.  Speech clear.  Skin warm and dry.

## 2017-12-05 NOTE — Discharge Instructions (Addendum)
Please seek medical attention for any high fevers, chest pain, shortness of breath, change in behavior, persistent vomiting, bloody stool or any other new or concerning symptoms.  

## 2018-02-12 ENCOUNTER — Ambulatory Visit: Payer: Medicaid Other | Admitting: Pharmacy Technician

## 2018-02-12 ENCOUNTER — Encounter (INDEPENDENT_AMBULATORY_CARE_PROVIDER_SITE_OTHER): Payer: Self-pay

## 2018-02-12 DIAGNOSIS — Z79899 Other long term (current) drug therapy: Secondary | ICD-10-CM

## 2018-02-12 NOTE — Progress Notes (Signed)
Completed [facility] application and contract.  Patient agreed to all terms of the [facility] contract.    Patient approved to receive medication assistance at [facility] as long as eligibility criteria continues to be met.    Provided patient with Civil engineer, contracting based on his particular needs.    [employer] and [employer] completed with patient.  Patient to take to Dr. Beryle Lathe in [place] to obtain signature.  Upon receipt of signed applications from provider, Tyler Aas Prescription Application will be submitted to [employer] and [employer] to [employer].  Referred patient for MTM.  [facility]

## 2018-02-14 ENCOUNTER — Other Ambulatory Visit: Payer: Self-pay

## 2018-02-14 ENCOUNTER — Ambulatory Visit: Payer: Medicaid Other | Admitting: Pharmacist

## 2018-02-14 ENCOUNTER — Encounter: Payer: Self-pay | Admitting: Pharmacist

## 2018-02-14 VITALS — BP 120/88 | Wt 260.0 lb

## 2018-02-14 DIAGNOSIS — Z79899 Other long term (current) drug therapy: Secondary | ICD-10-CM

## 2018-02-14 NOTE — Progress Notes (Signed)
Medication Management Clinic Visit Note  Patient: Shawn Estrada MRN: [id number] Date of Birth: 01-31-1975 PCP: Prince Solian, MD   Shawn Estrada 43 y.o. male presents for a medication management visit today.  BP 120/88 (BP Location: Right Arm, Patient Position: Sitting, Cuff Size: Large)   Wt 260 lb (117.9 kg)   BMI 33.38 kg/m   Patient Information   Past Medical History:  Diagnosis Date  . Adrenal insufficiency ([place])    secondary  . Cancer ([place])    kidney  . Cancer determined by renal biopsy ([place])   . Complication of anesthesia    slow to wake up  . Depression   . GERD (gastroesophageal reflux disease)   . Hypertension    No meds prescribed at this time  . PONV (postoperative nausea and vomiting)   . Sleep apnea    cpap      Past Surgical History:  Procedure Laterality Date  . CHOLECYSTECTOMY    . LAPAROSCOPIC NEPHRECTOMY Right 03/31/2016   Procedure: RIGHT LAPAROSCOPIC RADICAL NEPHRECTOMY;  Surgeon: Ardis Hughs, MD;  Location: [facility];  Service: Urology;  Laterality: Right;     Family History  Problem Relation Age of Onset  . Depression Sister     Family Support: Good  Lifestyle Diet: Breakfast: bagel or maybe a biscuit (skips a lot) Lunch: sandwich, chips (targets 60 carbs) Dinner: same as lunch Drinks: water, flavored water    Current Exercise Habits: Home exercise routine, Type of exercise: walking, Intensity: Mild       Social History   Substance and Sexual Activity  Alcohol Use No  . Alcohol/week: 0.0 - 7.0 standard drinks   Comment: Occasional use      Social History   Tobacco Use  Smoking Status Current Some Day Smoker  . Packs/day: 0.25  . Years: 26.00  . Pack years: 6.50  . Types: Cigarettes  . Start date: 10/23/1989  Smokeless Tobacco Current User  . Types: Snuff  Tobacco Comment   smokes about 10 cigarettes every 2 weeks.       Health Maintenance  Topic Date Due  . HIV Screening  05/17/1988  . TETANUS/TDAP   05/17/1992  . INFLUENZA VACCINE  Completed   Outpatient Encounter Medications as of 02/14/2018  Medication Sig  . alum & mag hydroxide-simeth (MAALOX/MYLANTA) 200-200-20 MG/5ML suspension Take 15 mLs by mouth every 6 (six) hours as needed for indigestion or heartburn.  . cabozantinib (CABOMETYX) 40 MG tablet Take 40 mg by mouth daily. Take on an empty stomach, 1 hour before or 2 hours after meals.  . calcium carbonate (TUMS - DOSED IN MG ELEMENTAL CALCIUM) 500 MG chewable tablet Chew 1 tablet by mouth 2 (two) times daily as needed for indigestion or heartburn.  . diphenoxylate-atropine (LOMOTIL) 2.5-0.025 MG tablet Take 1-2 tablets by mouth 4 (four) times daily.  Marland Kitchen escitalopram (LEXAPRO) 5 MG tablet Take 10 mg by mouth.   . famotidine (PEPCID) 40 MG tablet Take 40 mg by mouth 2 (two) times daily.  Marland Kitchen HUMALOG KWIKPEN 100 UNIT/ML KiwkPen Inject 20 Units into the skin 3 (three) times daily with meals. 28 with breakfast, 28 with lunch, 24 with dinner plus correction scale with each meal over 150  . hydrocortisone (CORTEF) 5 MG tablet Take 10-25 mg by mouth 2 (two) times daily. 20 mg at 8 am and 20 mg at 2 pm. Sick day rules to double dose  . loperamide (IMODIUM) 2 MG capsule Take 2 mg by mouth 4 (  four) times daily.  Marland Kitchen losartan (COZAAR) 25 MG tablet Take 100 mg by mouth daily.   . ondansetron (ZOFRAN) 4 MG tablet Take 4 mg by mouth every 8 (eight) hours as needed for nausea or vomiting.  . sucralfate (CARAFATE) 1 g tablet Take 1 g by mouth as needed.  . TRESIBA FLEXTOUCH 100 UNIT/ML SOPN FlexTouch Pen Inject 52 Units into the skin daily.   . TRULICITY 1.5 FY/1.0FB SOPN Inject 1.5 mg into the skin once a week.  . [DISCONTINUED] INLYTA 5 MG tablet Take 10 mg by mouth 2 (two) times daily.  . [DISCONTINUED] omeprazole (PRILOSEC) 20 MG capsule Take 20 mg by mouth daily.  . [DISCONTINUED] ondansetron (ZOFRAN) 4 MG tablet Take 1 tablet (4 mg total) by mouth daily as needed for nausea or vomiting. (Patient not  taking: Reported on 06/28/2017)  . [DISCONTINUED] prochlorperazine (COMPAZINE) 10 MG tablet Take 1 tablet (10 mg total) by mouth every 6 (six) hours as needed for nausea or vomiting. (Patient not taking: Reported on 10/26/2016)  . [DISCONTINUED] sucralfate (CARAFATE) 1 g tablet Take 1 tablet (1 g total) by mouth 4 (four) times daily -  with meals and at bedtime for 14 days.   No facility-administered encounter medications on file as of 02/14/2018.    Health Maintenance/Date Completed  Last ED visit: November 2019 Last Visit to PCP: over a year Next Visit to PCP: not yet Specialist Visit: endocrinologist, oncologist Dental Exam: been a while Eye Exam: last July Prostate Exam: not sure Colonoscopy: no Flu Vaccine: yes Pneumonia Vaccine: no   Assessment and Plan: Secondary adrenal insufficiency: hydrocortisone. Also has Solu-Cortef injection for sick day plan at which time he doubles dose. Takes hydrocortisone on a schedule, 8 am and 2 pm. Has not had an emergency where he needed to take injection. He refills as medication expires and always has pharmacist review with him how to do injection so that he remembers.  Drug-induced type 1 diabetes: Tresiba, Humalog, Trulicity. Humalog is running low (has about a week's supply). Takes Humalog set dose and then adds sliding scale for glucose over 150 (1 unit for every 50 units over). Checks sugars first thing in morning, before every meal and between meals and any time he feels its low (at least 8 times a day). Reports low sugars at least 2-3 times a week (usually not below 55, corrects when below 70). Does not notice it is necessarily at any specific time of the day, but does occur if he is more active. Normally, sugars average 120-200. He carries supplies with him at all times (includes regular soda, glucagon kit, snacks and emergency Solu-Cortef as above). He has educated his friends and family about how to use glucagon kit and plans to also make a  laminated instruction card to keep with the kit. He is very well educated about his diabetes and how to manage it appropriately.  GERD: famotidine, Maalox, TUMs. Mostly controls symptoms. Part of issue is from treatment coupled with preexisting symptoms. Watches what foods he eats to avoid issues.  HTN: losartan. Normally well-controlled. BP today WNL.  Situational depression: escitalopram. Works well for him.  Renal cell carcinoma: cabozantinib. In investigational study at [facility]. Had significant diarrhea so he has had to stop taking it for a while. Hopes to start back but waiting for diarrhea to improve and doctor to say he can start back.  Chemotherapy-induced diarrhea: Imodium and Lomotil. Takes both four times daily offset from each other. Reports that diarrhea still  not fully resolved.  Compliance: good at taking his medications. Occasionally misses Tresiba dose, but doesn't miss most of his other doses.  We filled several of his medications for him today including his insulins and Trulicity so that he will not run out while we wait for PAP.  Patient is in the process of trying to get Medicaid. He will let us know when that goes through. I will plan for him to return in 1 year but expect this will not be the case as he is planning on receiving Medicaid.  Tawnya Crook, PharmD Pharmacy Resident

## 2018-05-08 ENCOUNTER — Emergency Department: Payer: PRIVATE HEALTH INSURANCE

## 2018-05-08 ENCOUNTER — Other Ambulatory Visit: Payer: Self-pay

## 2018-05-08 ENCOUNTER — Emergency Department
Admission: EM | Admit: 2018-05-08 | Discharge: 2018-05-08 | Disposition: A | Discharge status: Home / Self Care | Payer: PRIVATE HEALTH INSURANCE | Attending: Emergency Medicine | Admitting: Emergency Medicine

## 2018-05-08 ENCOUNTER — Encounter: Payer: Self-pay | Admitting: Emergency Medicine

## 2018-05-08 DIAGNOSIS — Z794 Long term (current) use of insulin: Secondary | ICD-10-CM | POA: Diagnosis not present

## 2018-05-08 DIAGNOSIS — I10 Essential (primary) hypertension: Secondary | ICD-10-CM | POA: Insufficient documentation

## 2018-05-08 DIAGNOSIS — R1032 Left lower quadrant pain: Secondary | ICD-10-CM

## 2018-05-08 DIAGNOSIS — C7989 Secondary malignant neoplasm of other specified sites: Secondary | ICD-10-CM | POA: Diagnosis not present

## 2018-05-08 DIAGNOSIS — C7800 Secondary malignant neoplasm of unspecified lung: Secondary | ICD-10-CM | POA: Insufficient documentation

## 2018-05-08 DIAGNOSIS — C649 Malignant neoplasm of unspecified kidney, except renal pelvis: Secondary | ICD-10-CM | POA: Diagnosis not present

## 2018-05-08 DIAGNOSIS — E119 Type 2 diabetes mellitus without complications: Secondary | ICD-10-CM | POA: Insufficient documentation

## 2018-05-08 DIAGNOSIS — Z79899 Other long term (current) drug therapy: Secondary | ICD-10-CM | POA: Diagnosis not present

## 2018-05-08 DIAGNOSIS — F17210 Nicotine dependence, cigarettes, uncomplicated: Secondary | ICD-10-CM | POA: Insufficient documentation

## 2018-05-08 HISTORY — DX: Type 2 diabetes mellitus without complications: E11.9

## 2018-05-08 LAB — COMPREHENSIVE METABOLIC PANEL
ALT: 35 U/L (ref 0–44)
AST: 30 U/L (ref 15–41)
Albumin: 3.8 g/dL (ref 3.5–5.0)
Alkaline Phosphatase: 66 U/L (ref 38–126)
Anion gap: 11 (ref 5–15)
BUN: 10 mg/dL (ref 6–20)
CO2: 23 mmol/L (ref 22–32)
Calcium: 8.7 mg/dL — ABNORMAL LOW (ref 8.9–10.3)
Chloride: 107 mmol/L (ref 98–111)
Creatinine, Ser: 1.28 mg/dL — ABNORMAL HIGH (ref 0.61–1.24)
GFR calc Af Amer: >60 mL/min (ref >60)
GFR calc non Af Amer: >60 mL/min (ref >60)
Glucose, Bld: 100 mg/dL — ABNORMAL HIGH (ref 70–99)
Potassium: 3.3 mmol/L — ABNORMAL LOW (ref 3.5–5.1)
Sodium: 141 mmol/L (ref 135–145)
Total Bilirubin: 0.8 mg/dL (ref 0.3–1.2)
Total Protein: 7.7 g/dL (ref 6.5–8.1)

## 2018-05-08 LAB — CBC WITH DIFFERENTIAL/PLATELET
Abs Immature Granulocytes: 0.04 10*3/uL (ref 0.00–0.07)
Basophils Absolute: 0 10*3/uL (ref 0.0–0.1)
Basophils Relative: 1 %
Eosinophils Absolute: 0.2 10*3/uL (ref 0.0–0.5)
Eosinophils Relative: 3 %
HCT: 44.4 % (ref 39.0–52.0)
Hemoglobin: 15.1 g/dL (ref 13.0–17.0)
Immature Granulocytes: 1 %
Lymphocytes Relative: 36 %
Lymphs Abs: 2.4 10*3/uL (ref 0.7–4.0)
MCH: 30.3 pg (ref 26.0–34.0)
MCHC: 34 g/dL (ref 30.0–36.0)
MCV: 89 fL (ref 80.0–100.0)
Monocytes Absolute: 0.7 10*3/uL (ref 0.1–1.0)
Monocytes Relative: 10 %
Neutro Abs: 3.2 10*3/uL (ref 1.7–7.7)
Neutrophils Relative %: 49 %
Platelets: 248 10*3/uL (ref 150–400)
RBC: 4.99 MIL/uL (ref 4.22–5.81)
RDW: 12.8 % (ref 11.5–15.5)
WBC: 6.6 10*3/uL (ref 4.0–10.5)
nRBC: 0 % (ref 0.0–0.2)

## 2018-05-08 LAB — LIPASE, BLOOD: Lipase: 30 U/L (ref 11–51)

## 2018-05-08 LAB — TROPONIN I: Troponin I: <0.03 ng/mL (ref <0.03)

## 2018-05-08 MED ORDER — SODIUM CHLORIDE 0.9 % IV BOLUS
1000.0000 mL | Freq: Once | INTRAVENOUS | Status: AC
  Administered 2018-05-08: 07:00:00 1000 mL via INTRAVENOUS

## 2018-05-08 MED ORDER — IOHEXOL 300 MG/ML  SOLN
100.0000 mL | Freq: Once | INTRAMUSCULAR | Status: AC | PRN
  Administered 2018-05-08: 100 mL via INTRAVENOUS

## 2018-05-08 MED ORDER — IOHEXOL 240 MG/ML SOLN
50.0000 mL | Freq: Once | INTRAMUSCULAR | Status: AC | PRN
  Administered 2018-05-08: 50 mL via ORAL

## 2018-05-08 NOTE — Discharge Instructions (Signed)

## 2018-05-08 NOTE — ED Provider Notes (Signed)
[facility] Emergency Department Provider Note  ____________________________________________   First MD Initiated Contact with Patient 05/08/18 0430     (approximate)  I have reviewed the triage vital signs and the nursing notes.   HISTORY  Chief Complaint Abdominal Pain    HPI Shawn Estrada is a 43 y.o. 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  He is currently on oral chemotherapy and managed by oncology at [facility].  He presents by private vehicle tonight for evaluation of acute onset episodic left-sided lower abdominal pain.  He states that this happens to him some time but it was much worse tonight although it has gotten significantly better by this time.  Nothing in particular makes the symptoms better or worse.  It is usually accompanied with diarrhea but this is a chronic problem for him and he has been on medication for his diarrhea for some time.  He feels like when the pain is going on that his bowel movements do not help and that he still has a sensation of either being blocked or needing to have another bowel movement.  He denies fever/chills, chest pain, cough, nasal congestion, sore throat, and dysuria.  He states that he had some nausea and "spitting up" earlier.  He tried taking some Pepto-Bismol but it did not help.  He has been isolating himself given the COVID-19 pandemic.         Past Medical History:  Diagnosis Date  . Adrenal insufficiency ([place])    secondary  . Cancer ([place])    kidney  . Cancer determined by renal biopsy ([place])   . Complication of anesthesia    slow to wake up  . Depression   . Diabetes mellitus without complication ([place])   . GERD (gastroesophageal reflux disease)   . Hypertension    No meds prescribed at this time  . PONV (postoperative nausea and vomiting)   . Sleep apnea     cpap    Patient Active Problem List   Diagnosis Date Noted  . Kidney cancer, primary, with metastasis from kidney to other site [facility]) 09/26/2016  . Goals of care, counseling/discussion 09/26/2016  . Primary malignant neoplasm of kidney with metastasis from kidney to other site [facility]) 09/26/2016  . Right renal mass 03/31/2016    Past Surgical History:  Procedure Laterality Date  . CHOLECYSTECTOMY    . LAPAROSCOPIC NEPHRECTOMY Right 03/31/2016   Procedure: RIGHT LAPAROSCOPIC RADICAL NEPHRECTOMY;  Surgeon: Ardis Hughs, MD;  Location: [facility];  Service: Urology;  Laterality: Right;    Prior to Admission medications   Medication Sig Start Date End Date Taking? Authorizing Provider  alum & mag hydroxide-simeth (MAALOX/MYLANTA) 200-200-20 MG/5ML suspension Take 15 mLs by mouth every 6 (six) hours as needed for indigestion or heartburn.    [provider]  cabozantinib (CABOMETYX) 40 MG tablet Take 40 mg by mouth daily. Take on an empty stomach, 1 hour before or 2 hours after meals.    [provider]  calcium carbonate (TUMS - DOSED IN MG ELEMENTAL CALCIUM) 500 MG chewable tablet Chew 1 tablet by mouth 2 (two) times daily as needed for indigestion or heartburn.    [provider]  diphenoxylate-atropine (LOMOTIL) 2.5-0.025 MG tablet Take 1-2 tablets by mouth 4 (four) times daily.    [provider]  escitalopram (LEXAPRO) 5 MG tablet  Take 10 mg by mouth.     [provider]  famotidine (PEPCID) 40 MG tablet Take 40 mg by mouth 2 (two) times daily.    [provider]  HUMALOG KWIKPEN 100 UNIT/ML KiwkPen Inject 20 Units into the skin 3 (three) times daily with meals. 28 with breakfast, 28 with lunch, 24 with dinner plus correction scale with each meal over 150    [provider]  hydrocortisone (CORTEF) 5 MG tablet Take 10-25 mg by mouth 2 (two) times daily. 20 mg at 8 am and 20 mg at 2 pm. Sick day rules to double dose     [provider]  loperamide (IMODIUM) 2 MG capsule Take 2 mg by mouth 4 (four) times daily.    [provider]  losartan (COZAAR) 25 MG tablet Take 100 mg by mouth daily.     [provider]  ondansetron (ZOFRAN) 4 MG tablet Take 4 mg by mouth every 8 (eight) hours as needed for nausea or vomiting.    [provider]  sucralfate (CARAFATE) 1 g tablet Take 1 g by mouth as needed.    [provider]  TRESIBA FLEXTOUCH 100 UNIT/ML SOPN FlexTouch Pen Inject 52 Units into the skin daily.     [provider]  TRULICITY 1.5 XB/3.5HG SOPN Inject 1.5 mg into the skin once a week.    [provider]    Allergies Nivolumab; Lisinopril; Nsaids; and Tolmetin  Family History  Problem Relation Age of Onset  . Depression Sister     Social History Social History   Tobacco Use  . Smoking status: Current Some Day Smoker    Packs/day: 0.25    Years: 26.00    Pack years: 6.50    Types: Cigarettes    Start date: 10/23/1989  . Smokeless tobacco: Current User    Types: Snuff  . Tobacco comment: smokes about 10 cigarettes every 2 weeks.   Substance Use Topics  . Alcohol use: No    Alcohol/week: 0.0 - 7.0 standard drinks    Comment: Occasional use  . Drug use: No    Review of Systems Constitutional: No fever/chills Eyes: No visual changes. ENT: No sore throat. Cardiovascular: Denies chest pain. Respiratory: Denies shortness of breath. Gastrointestinal: Left-sided lower abdominal pain with some sensation of bowel urgency, questionable diarrhea, and an episode of "spitting up" and nausea, all of which are now improved. Genitourinary: Negative for dysuria. Musculoskeletal: Negative for neck pain.  Negative for back pain. Integumentary: Negative for rash. Neurological: Negative for headaches, focal weakness or numbness.   ____________________________________________   PHYSICAL EXAM:  VITAL SIGNS: ED Triage Vitals  Enc Vitals  Group     BP 05/08/18 0412 (!) 159/109     Pulse Rate 05/08/18 0412 90     Resp 05/08/18 0412 18     Temp 05/08/18 0412 97.8 F (36.6 C)     Temp Source 05/08/18 0412 Oral     SpO2 05/08/18 0412 96 %     Weight 05/08/18 0409 115.7 kg (255 lb)     Height 05/08/18 0409 1.88 m (6\' 2" )     Head Circumference --      Peak Flow --      Pain Score 05/08/18 0409 4     Pain Loc --      Pain Edu? --      Excl. in [place]? --     Constitutional: Alert and oriented. Well appearing and in no acute distress.  Eyes: Conjunctivae are normal.  Head: Atraumatic. Nose: No congestion/rhinnorhea. Mouth/Throat: Mucous membranes are moist. Neck: No stridor.  No meningeal signs.   Cardiovascular: Normal rate, regular rhythm. Good peripheral circulation. Grossly normal heart sounds. Respiratory: Normal respiratory effort.  No retractions. No audible wheezing. Gastrointestinal: Soft and nondistended.  Mild diffuse tenderness to palpation throughout the abdomen but without any focal tenderness and no rebound or guarding.  No evidence of peritonitis.   Musculoskeletal: No lower extremity tenderness nor edema. No gross deformities of extremities. Neurologic:  Normal speech and language. No gross focal neurologic deficits are appreciated.  Skin:  Skin is warm, dry and intact. No rash noted. Psychiatric: Mood and affect are normal. Speech and behavior are normal.  ____________________________________________   LABS (all labs ordered are listed, but only abnormal results are displayed)  Labs Reviewed  COMPREHENSIVE METABOLIC PANEL - Abnormal; Notable for the following components:      Result Value   Potassium 3.3 (*)    Glucose, Bld 100 (*)    Creatinine, Ser 1.28 (*)    Calcium 8.7 (*)    All other components within normal limits  URINE CULTURE  CBC WITH DIFFERENTIAL/PLATELET  LIPASE, BLOOD  TROPONIN I  URINALYSIS, COMPLETE (UACMP) WITH MICROSCOPIC   ____________________________________________  EKG   None - EKG not ordered by ED physician ____________________________________________  RADIOLOGY   ED MD interpretation:  worsening metastatic disease compared to prior CT in [facility] system, but roughly similar to CT RCC protocol at [facility] from two months ago.  No acute/emergent condition.  Official radiology report(s): Ct Abdomen Pelvis W Contrast  Result Date: 05/08/2018 CLINICAL DATA:  Nausea and vomiting. Bowel obstruction. Abdominal pain. Personal history of renal cell carcinoma with metastatic disease. Status post right nephrectomy. EXAM: CT ABDOMEN AND PELVIS WITH CONTRAST TECHNIQUE: Multidetector CT imaging of the abdomen and pelvis was performed using the standard protocol following bolus administration of intravenous contrast. CONTRAST:  175mL OMNIPAQUE IOHEXOL 300 MG/ML SOLN, 59mL OMNIPAQUE IOHEXOL 240 MG/ML SOLN COMPARISON:  CT of the abdomen and pelvis 09/23/2016 FINDINGS: Lower chest: The lung bases are clear without focal nodule, mass, or airspace disease. The heart size is normal. No significant pleural or pericardial effusion is present. Hepatobiliary: There is fatty infiltration of the liver without a discrete lesion. The common bile duct is within normal limits following cholecystectomy. Pancreas: Previously noted lesion in the pancreatic head is not visualized on venous phase imaging. Pancreas is otherwise unremarkable. Spleen: Normal in size without focal abnormality. Adrenals/Urinary Tract: Previously noted left adrenal lesion has resolved. A new left adrenal lesion measures 1.9 x 1.5 x 1.0 cm. The right adrenal gland demonstrates calcifications. No focal lesion is present. Right nephrectomy is noted. Previously noted soft tissue in the nephrectomy bed has increased in size, now measuring 3.1 x 3.8 x 1.4 cm. The left ureter is within normal limits. The urinary bladder is unremarkable. Stomach/Bowel: Stomach is moderately distended. No obstructing mass lesion is present at the outlet.  Duodenum is within normal limits. The small bowel is within normal limits. Oral contrast is seen to the terminal ileum. Appendix is visualized and within normal limits. It appendix extends to a focal mass lesion adjacent to the right psoas muscle markedly increased since the prior study, now measuring 2.4 x 2.6 x 3.1 cm. The cecum is otherwise normal. A collection lateral to the cecum is new since the prior exam. This is concerning for a metastatic deposit measuring 6.5 x 3.8 cm. Two lesions near the hepatic  flexure have increased in size, now measuring 12 mm each. Ascending colon extends into the nephrectomy bed. The ascending and transverse colon are otherwise within normal limits. Descending and sigmoid colon are normal. Vascular/Lymphatic: No significant vascular findings are present. No enlarged abdominal or pelvic lymph nodes. Reproductive: Prostate is unremarkable. Other: No focal fluid collection or ventral hernia is evident. Musculoskeletal: Lytic lesion at L5 is stable. No new lytic or blastic lesions are present. Vertebral body heights are maintained. Bony pelvis is within normal limits. The right SI joint is fused. The hips are located and within normal limits bilaterally. IMPRESSION: 1. Increased size of multiple soft tissue deposits involving the nephrectomy bed and right-sided retroperitoneum consistent with progressive metastatic disease. 2. Lesion adjacent to the cecum measures 6.5 x 3.8 cm. This is worrisome for a focal metastasis. Alternatively, this could represent a retained contained rupture or diverticulum. Density is consistent with remainder of the cecum. This was not present on the prior exam. 3. Enlarged metastatic deposit adjacent to the right psoas muscle. 4. New left adrenal nodule measuring 19 mm likely represents a focal metastasis. 5. No bowel obstruction. 6. Moderate distention of the stomach without obstructing lesion. 7. Cholecystectomy. 8. Right nephrectomy. Electronically Signed    By: San Morelle M.D.   On: 05/08/2018 06:25    ____________________________________________   PROCEDURES   Procedure(s) performed (including Critical Care):  Procedures   ____________________________________________   INITIAL IMPRESSION / MDM / [place] / ED COURSE  As part of my medical decision making, I reviewed the following data within the [place] notes reviewed and incorporated, Labs reviewed , Old chart reviewed, Notes from prior ED visits and [place] Controlled Substance Database      Shawn Estrada was evaluated in Emergency Department on 05/08/2018 for the symptoms described in the history of present illness. He was evaluated in the context of the global COVID-19 pandemic, which necessitated consideration that the patient might be at risk for infection with the SARS-CoV-2 virus that causes COVID-19. Institutional protocols and algorithms that pertain to the evaluation of patients at risk for COVID-19 are in a state of rapid change based on information released by regulatory bodies including the CDC and federal and state organizations. These policies and algorithms were followed during the patient's care in the ED.  Differential diagnosis includes, but is not limited to, SBO/ileus, diverticulitis, complication of his cancer including additional metastases, carcinomatosis, less likely appendicitis, pyelonephritis, renal colic.  The patient is well-appearing and in no distress at this time but he is certainly at risk for complications given his renal cell carcinoma status post right nephrectomy and his ongoing oral chemotherapy.  I will evaluate with lab work and plan for a CT scan with oral and IV contrast.  However if his renal function is insufficient for IV contrast we will obtain an alternate study.  Urinalysis and lab work is pending.  Vital signs are stable and he is afebrile.  He does not require any analgesia at this time.   Clinical Course as of May 07 641  Tue May 08, 2018  0539 Normal CBC  CBC with Differential/Platelet [CF]  [phone] CMP within normal limits with normal GFT. CT with oral and IV contrast is pending. Ordered 1L NS IV bolus to help clear the contrast from his system in the setting of prior nephrectomy.  Comprehensive metabolic panel(!) [CF]  1740 Troponin I: <0.03 [CF]  0614 Lipase: 30 [CF]  0630 No evidence of  any acute or emergent findings.  Although the CT scan shows worsening metastases compared to the last one on record within the [facility] system, when I compare the reports to the CT renal cell carcinoma protocol he had performed at [facility] 2 months ago, they look roughly similar.  The patient is not in pain at this time and is comfortable with the plan for discharge and close follow-up with his oncologist.  I asked Vivien Rota (CT technologist) to both burn a CD for the patient to take with him and to power push the images to the [facility] system to help with follow-up.  The patient understands and agrees with the plan for close follow-up and I gave him my usual customary return precautions as well.  CT ABDOMEN PELVIS W CONTRAST [CF]    Clinical Course User Index [CF] Hinda Kehr, MD     ____________________________________________  FINAL CLINICAL IMPRESSION(S) / ED DIAGNOSES  Final diagnoses:  Left lower quadrant abdominal pain  Metastatic renal cell carcinoma, unspecified laterality ([place])     MEDICATIONS GIVEN DURING THIS VISIT:  Medications  sodium chloride 0.9 % bolus 1,000 mL (has no administration in time range)  iohexol (OMNIPAQUE) 240 MG/ML injection 50 mL (50 mLs Oral Contrast Given 05/08/18 0600)  iohexol (OMNIPAQUE) 300 MG/ML solution 100 mL (100 mLs Intravenous Contrast Given 05/08/18 0559)     ED Discharge Orders    None       Note:  This document was prepared using Dragon voice recognition software and may include unintentional dictation errors.   Hinda Kehr, MD 05/08/18  [phone]

## 2018-05-08 NOTE — ED Triage Notes (Signed)
Patient ambulatory to triage with steady gait, without difficulty or distress noted, mask in place; pt reports left sided abd pain radiating thru abd accomp by nausea; currently receiving chemo for renal cell carcinoma with metastasis

## 2018-05-08 NOTE — ED Notes (Signed)
Iv running in.

## 2018-07-16 ENCOUNTER — Telehealth: Payer: Self-pay

## 2018-07-16 NOTE — Telephone Encounter (Signed)
VM left for patient to schedule visit with Palliative Care.  

## 2018-07-17 ENCOUNTER — Telehealth: Payer: Self-pay

## 2018-07-17 NOTE — Telephone Encounter (Signed)
Received return call from patient to schedule visit with Palliative Care. Scheduled for Friday 07/20/2018 @ 9am. Covid screen negative.

## 2018-07-20 ENCOUNTER — Other Ambulatory Visit: Payer: Self-pay

## 2018-07-20 ENCOUNTER — Emergency Department: Payer: PRIVATE HEALTH INSURANCE

## 2018-07-20 ENCOUNTER — Other Ambulatory Visit: Payer: PRIVATE HEALTH INSURANCE | Admitting: Adult Health Nurse Practitioner

## 2018-07-20 ENCOUNTER — Emergency Department
Admission: EM | Admit: 2018-07-20 | Discharge: 2018-07-21 | Disposition: A | Discharge status: Home / Self Care | Payer: PRIVATE HEALTH INSURANCE | Attending: Emergency Medicine | Admitting: Emergency Medicine

## 2018-07-20 DIAGNOSIS — I10 Essential (primary) hypertension: Secondary | ICD-10-CM | POA: Insufficient documentation

## 2018-07-20 DIAGNOSIS — R0602 Shortness of breath: Secondary | ICD-10-CM | POA: Diagnosis present

## 2018-07-20 DIAGNOSIS — Z9221 Personal history of antineoplastic chemotherapy: Secondary | ICD-10-CM | POA: Diagnosis not present

## 2018-07-20 DIAGNOSIS — R739 Hyperglycemia, unspecified: Secondary | ICD-10-CM

## 2018-07-20 DIAGNOSIS — E1165 Type 2 diabetes mellitus with hyperglycemia: Secondary | ICD-10-CM | POA: Diagnosis not present

## 2018-07-20 DIAGNOSIS — F17228 Nicotine dependence, chewing tobacco, with other nicotine-induced disorders: Secondary | ICD-10-CM | POA: Insufficient documentation

## 2018-07-20 DIAGNOSIS — Z20828 Contact with and (suspected) exposure to other viral communicable diseases: Secondary | ICD-10-CM | POA: Insufficient documentation

## 2018-07-20 DIAGNOSIS — C649 Malignant neoplasm of unspecified kidney, except renal pelvis: Secondary | ICD-10-CM | POA: Diagnosis not present

## 2018-07-20 DIAGNOSIS — R05 Cough: Secondary | ICD-10-CM | POA: Insufficient documentation

## 2018-07-20 DIAGNOSIS — F17210 Nicotine dependence, cigarettes, uncomplicated: Secondary | ICD-10-CM | POA: Insufficient documentation

## 2018-07-20 DIAGNOSIS — Z515 Encounter for palliative care: Secondary | ICD-10-CM

## 2018-07-20 LAB — CBC WITH DIFFERENTIAL/PLATELET
Abs Immature Granulocytes: 0.03 10*3/uL (ref 0.00–0.07)
Basophils Absolute: 0.1 10*3/uL (ref 0.0–0.1)
Basophils Relative: 1 %
Eosinophils Absolute: 0.1 10*3/uL (ref 0.0–0.5)
Eosinophils Relative: 1 %
HCT: 41.4 % (ref 39.0–52.0)
Hemoglobin: 13.8 g/dL (ref 13.0–17.0)
Immature Granulocytes: 0 %
Lymphocytes Relative: 15 %
Lymphs Abs: 1.3 10*3/uL (ref 0.7–4.0)
MCH: 29.1 pg (ref 26.0–34.0)
MCHC: 33.3 g/dL (ref 30.0–36.0)
MCV: 87.2 fL (ref 80.0–100.0)
Monocytes Absolute: 0.4 10*3/uL (ref 0.1–1.0)
Monocytes Relative: 5 %
Neutro Abs: 6.6 10*3/uL (ref 1.7–7.7)
Neutrophils Relative %: 78 %
Platelets: 321 10*3/uL (ref 150–400)
RBC: 4.75 MIL/uL (ref 4.22–5.81)
RDW: 14.6 % (ref 11.5–15.5)
WBC: 8.4 10*3/uL (ref 4.0–10.5)
nRBC: 0 % (ref 0.0–0.2)

## 2018-07-20 LAB — GLUCOSE, CAPILLARY
Glucose-Capillary: 334 mg/dL — ABNORMAL HIGH (ref 70–99)
Glucose-Capillary: 212 mg/dL — ABNORMAL HIGH (ref 70–99)

## 2018-07-20 LAB — URINALYSIS, COMPLETE (UACMP) WITH MICROSCOPIC
Bacteria, UA: NONE SEEN
Bilirubin Urine: NEGATIVE
Glucose, UA: >=500 mg/dL — AB
Ketones, ur: NEGATIVE mg/dL
Leukocytes,Ua: NEGATIVE
Nitrite: NEGATIVE
Protein, ur: NEGATIVE mg/dL
Specific Gravity, Urine: 1.028 (ref 1.005–1.030)
Squamous Epithelial / HPF: NONE SEEN (ref 0–5)
pH: 5 (ref 5.0–8.0)

## 2018-07-20 LAB — COMPREHENSIVE METABOLIC PANEL
ALT: 39 U/L (ref 0–44)
AST: 33 U/L (ref 15–41)
Albumin: 4.2 g/dL (ref 3.5–5.0)
Alkaline Phosphatase: 53 U/L (ref 38–126)
Anion gap: 12 (ref 5–15)
BUN: 19 mg/dL (ref 6–20)
CO2: 21 mmol/L — ABNORMAL LOW (ref 22–32)
Calcium: 9.2 mg/dL (ref 8.9–10.3)
Chloride: 102 mmol/L (ref 98–111)
Creatinine, Ser: 1.6 mg/dL — ABNORMAL HIGH (ref 0.61–1.24)
GFR calc Af Amer: 59 mL/min — ABNORMAL LOW (ref >60)
GFR calc non Af Amer: 51 mL/min — ABNORMAL LOW (ref >60)
Glucose, Bld: 343 mg/dL — ABNORMAL HIGH (ref 70–99)
Potassium: 4 mmol/L (ref 3.5–5.1)
Sodium: 135 mmol/L (ref 135–145)
Total Bilirubin: 0.4 mg/dL (ref 0.3–1.2)
Total Protein: 7.8 g/dL (ref 6.5–8.1)

## 2018-07-20 LAB — LACTIC ACID, PLASMA: Lactic Acid, Venous: 2.5 mmol/L (ref 0.5–1.9)

## 2018-07-20 LAB — BRAIN NATRIURETIC PEPTIDE: B Natriuretic Peptide: 21 pg/mL (ref 0.0–100.0)

## 2018-07-20 MED ORDER — HYDROCORTISONE NA SUCCINATE PF 100 MG IJ SOLR
50.0000 mg | Freq: Once | INTRAMUSCULAR | Status: AC
  Administered 2018-07-20: 23:00:00 50 mg via INTRAVENOUS
  Filled 2018-07-20: qty 2

## 2018-07-20 MED ORDER — IOPAMIDOL (ISOVUE-370) INJECTION 76%
100.0000 mL | Freq: Once | INTRAVENOUS | Status: AC | PRN
  Administered 2018-07-20: 75 mL via INTRAVENOUS

## 2018-07-20 MED ORDER — SODIUM CHLORIDE 0.9 % IV BOLUS: 500.0000 mL | Freq: Once | INTRAVENOUS | Status: DC

## 2018-07-20 MED ORDER — SODIUM CHLORIDE 0.9% FLUSH: 3.0000 mL | Freq: Once | INTRAVENOUS | Status: DC

## 2018-07-20 NOTE — ED Provider Notes (Signed)
[facility] Emergency Department Provider Note   ____________________________________________   First MD Initiated Contact with Patient 07/20/18 2220     (approximate)  I have reviewed the triage vital signs and the nursing notes.   HISTORY  Chief Complaint Shortness of Breath and Hyperglycemia    HPI ZENON LEAF is a 43 y.o. male history of adrenal insufficiency, and renal carcinoma   Patient reports about a week and a half now has been experiencing some increased feeling of shortness of breath.  He has a dry cough but reports is been ongoing for a year.  No exposure to coronavirus no fever.  He is also noticed over the last week and a half he had trouble controlling his blood sugar and seems to be creeping up daily to the point today that it said it was "high" and has been using his insulin including nighttime dosing as well as his short acting and he reports has been having close follow-up with his endocrinologist and they were having a lot of trouble titrating his insulin regimen  He is currently on chemotherapy for renal carcinoma, has not had any chemotherapeutic medication since Sunday.  No chest pain.  No abdominal pain no nausea vomiting.  No headaches no fevers no chills.  Ports just increasing feeling of shortness of breath.  No leg swelling.  No history of heart failure.  No wheezing  No exposure to [place] with [facility] and they recommended he come for evaluation today because of increasing shortness of breath and trouble with blood sugar control   Past Medical History:  Diagnosis Date  . Adrenal insufficiency ([place])    secondary  . Cancer ([place])    kidney  . Cancer determined by renal biopsy ([place])   . Complication of anesthesia    slow to wake up  . Depression   . Diabetes mellitus without complication ([place])   . GERD (gastroesophageal reflux disease)   . Hypertension    No meds prescribed at this time  . PONV  (postoperative nausea and vomiting)   . Sleep apnea    cpap    Patient Active Problem List   Diagnosis Date Noted  . Kidney cancer, primary, with metastasis from kidney to other site [facility]) 09/26/2016  . Goals of care, counseling/discussion 09/26/2016  . Primary malignant neoplasm of kidney with metastasis from kidney to other site [facility]) 09/26/2016  . Right renal mass 03/31/2016    Past Surgical History:  Procedure Laterality Date  . CHOLECYSTECTOMY    . LAPAROSCOPIC NEPHRECTOMY Right 03/31/2016   Procedure: RIGHT LAPAROSCOPIC RADICAL NEPHRECTOMY;  Surgeon: Ardis Hughs, MD;  Location: [facility] ORS;  Service: Urology;  Laterality: Right;    Prior to Admission medications   Medication Sig Start Date End Date Taking? Authorizing Provider  alum & mag hydroxide-simeth (MAALOX/MYLANTA) 200-200-20 MG/5ML suspension Take 15 mLs by mouth every 6 (six) hours as needed for indigestion or heartburn.    [provider]  cabozantinib (CABOMETYX) 40 MG tablet Take 40 mg by mouth daily. Take on an empty stomach, 1 hour before or 2 hours after meals.    [provider]  calcium carbonate (TUMS - DOSED IN MG ELEMENTAL CALCIUM) 500 MG chewable tablet Chew 1 tablet by mouth 2 (two) times daily as needed for indigestion or heartburn.    [provider]  diphenoxylate-atropine (LOMOTIL) 2.5-0.025 MG tablet Take 1-2 tablets by mouth 4 (four) times daily.    [provider]  escitalopram (LEXAPRO) 5 MG tablet Take 10 mg by mouth.     [provider]  famotidine (PEPCID) 40 MG tablet Take 40 mg by mouth 2 (two) times daily.    [provider]  HUMALOG KWIKPEN 100 UNIT/ML KiwkPen Inject 20 Units into the skin 3 (three) times daily with meals. 28 with breakfast, 28 with lunch, 24 with dinner plus correction scale with each meal over 150    [provider]  hydrocortisone (CORTEF) 5 MG tablet Take 10-25 mg by mouth 2 (two) times daily. 20 mg at 8 am and  20 mg at 2 pm. Sick day rules to double dose    [provider]  loperamide (IMODIUM) 2 MG capsule Take 2 mg by mouth 4 (four) times daily.    [provider]  losartan (COZAAR) 25 MG tablet Take 100 mg by mouth daily.     [provider]  ondansetron (ZOFRAN) 4 MG tablet Take 4 mg by mouth every 8 (eight) hours as needed for nausea or vomiting.    [provider]  sucralfate (CARAFATE) 1 g tablet Take 1 g by mouth as needed.    [provider]  TRESIBA FLEXTOUCH 100 UNIT/ML SOPN FlexTouch Pen Inject 52 Units into the skin daily.     [provider]  TRULICITY 1.5 ZO/1.0RU SOPN Inject 1.5 mg into the skin once a week.    [provider]    Allergies Nivolumab, Lisinopril, Nsaids, and Tolmetin  Family History  Problem Relation Age of Onset  . Depression Sister     Social History Social History   Tobacco Use  . Smoking status: Current Some Day Smoker    Packs/day: 0.25    Years: 26.00    Pack years: 6.50    Types: Cigarettes    Start date: 10/23/1989  . Smokeless tobacco: Current User    Types: Snuff  . Tobacco comment: smokes about 10 cigarettes every 2 weeks.   Substance Use Topics  . Alcohol use: No    Alcohol/week: 0.0 - 7.0 standard drinks    Comment: Occasional use  . Drug use: No    Review of Systems Constitutional: No fever/chills Eyes: No visual changes. ENT: No sore throat. Cardiovascular: Denies chest pain. Respiratory: Slow but slightly increasing feeling of shortness of breath daily Gastrointestinal: No abdominal pain.   Genitourinary: Negative for dysuria.  Has noticed he is urinating quite a bit and feels a bit dry Musculoskeletal: Negative for back pain. Skin: Negative for rash. Neurological: Negative for headaches, areas of focal weakness or numbness.    ____________________________________________   PHYSICAL EXAM:  VITAL SIGNS: ED Triage Vitals  Enc Vitals Group     BP 07/20/18  2053 (!) 145/94     Pulse Rate 07/20/18 2053 (!) 116     Resp 07/20/18 2053 (!) 22     Temp 07/20/18 2053 99.2 F (37.3 C)     Temp src --      SpO2 07/20/18 2053 94 %     Weight 07/20/18 2051 260 lb (117.9 kg)     Height 07/20/18 2051 6\' 2"  (1.88 m)     Head Circumference --      Peak Flow --      Pain Score 07/20/18 2051 0     Pain Loc --      Pain Edu? --      Excl. in [place]? --     Constitutional: Alert and oriented. Well appearing and in  no acute distress.  Very pleasant. Eyes: Conjunctivae are normal. Head: Atraumatic. Nose: No congestion/rhinnorhea. Mouth/Throat: Mucous membranes are slightly dry. Neck: No stridor.  Cardiovascular: Minimally tachycardic rate, regular rhythm. Grossly normal heart sounds.  Good peripheral circulation. Respiratory: Normal effort except for chest slight tachypnea.  No retractions. Lungs CTAB. Gastrointestinal: Soft and nontender. No distention. Musculoskeletal: No lower extremity tenderness nor edema. Neurologic:  Normal speech and language. No gross focal neurologic deficits are appreciated.  Skin:  Skin is warm, dry and intact. No rash noted. Psychiatric: Mood and affect are normal. Speech and behavior are normal.  ____________________________________________   LABS (all labs ordered are listed, but only abnormal results are displayed)  Labs Reviewed  LACTIC ACID, PLASMA - Abnormal; Notable for the following components:      Result Value   Lactic Acid, Venous 2.5 (*)    All other components within normal limits  COMPREHENSIVE METABOLIC PANEL - Abnormal; Notable for the following components:   CO2 21 (*)    Glucose, Bld 343 (*)    Creatinine, Ser 1.60 (*)    GFR calc non Af Amer 51 (*)    GFR calc Af Amer 59 (*)    All other components within normal limits  URINALYSIS, COMPLETE (UACMP) WITH MICROSCOPIC - Abnormal; Notable for the following components:   Color, Urine YELLOW (*)    APPearance CLEAR (*)    Glucose, UA >=500 (*)     Hgb urine dipstick SMALL (*)    All other components within normal limits  GLUCOSE, CAPILLARY - Abnormal; Notable for the following components:   Glucose-Capillary 334 (*)    All other components within normal limits  GLUCOSE, CAPILLARY - Abnormal; Notable for the following components:   Glucose-Capillary 212 (*)    All other components within normal limits  SARS CORONAVIRUS 2 (HOSPITAL ORDER, [place] [facility])  CBC WITH DIFFERENTIAL/PLATELET  BRAIN NATRIURETIC PEPTIDE  LACTIC ACID, PLASMA  CBG MONITORING, ED  CBG MONITORING, ED  CBG MONITORING, ED  CBG MONITORING, ED  CBG MONITORING, ED  CBG MONITORING, ED  CBG MONITORING, ED  CBG MONITORING, ED  CBG MONITORING, ED  CBG MONITORING, ED  CBG MONITORING, ED  CBG MONITORING, ED  CBG MONITORING, ED  CBG MONITORING, ED   ____________________________________________  EKG  Reviewed entered by me at 2110 Heart rate 100 QRS 80 QTc 420 Sinus tachycardia without acute ischemia ____________________________________________  RADIOLOGY  Ct Angio Chest Pe W And/or Wo Contrast  Result Date: 07/20/2018 CLINICAL DATA:  Hyperglycemia and shortness of breath, history of renal cell carcinoma EXAM: CT ANGIOGRAPHY CHEST WITH CONTRAST TECHNIQUE: Multidetector CT imaging of the chest was performed using the standard protocol during bolus administration of intravenous contrast. Multiplanar CT image reconstructions and MIPs were obtained to evaluate the vascular anatomy. CONTRAST:  12mL ISOVUE-370 IOPAMIDOL (ISOVUE-370) INJECTION 76% COMPARISON:  09/23/2016 FINDINGS: Cardiovascular: Thoracic aorta is within normal limits without aneurysmal dilatation or dissection. No significant cardiac enlargement is noted. The pulmonary artery shows a normal branching pattern. No filling defect to suggest pulmonary embolism is noted. Mediastinum/Nodes: Thoracic inlet is within normal limits. The mediastinum demonstrates mild lipomatosis. No  significant hilar or mediastinal adenopathy is noted. The esophagus is within normal limits. Lungs/Pleura: Lungs are well aerated bilaterally. Previously seen pulmonary nodules have reduced in both size and number. Dominant residual nodules are seen on image number 18 of series 6 in the right upper lobe and image number 62 of series 6 in the left lower  lobe. These both appear decreased in size from the prior exam. Scattered other nodules are not visualized on today's exam. Upper Abdomen: No focal abnormality in the upper abdomen is seen. Musculoskeletal: Bony structures show no acute abnormality. Review of the MIP images confirms the above findings. IMPRESSION: No evidence of pulmonary emboli. Previously seen pulmonary nodules have decreased in both size and number when compared with the prior exam. No new nodularity is seen. Electronically Signed   By: Inez Catalina M.D.   On: 07/20/2018 23:08   Dg Chest Port 1 View  Result Date: 07/20/2018 CLINICAL DATA:  Hyperglycemia EXAM: PORTABLE CHEST 1 VIEW COMPARISON:  06/28/2017 FINDINGS: No focal airspace disease or effusion. Stable cardiomediastinal silhouette. No pneumothorax. IMPRESSION: No active disease. Electronically Signed   By: Donavan Foil M.D.   On: 07/20/2018 22:42    CT scan reviewed negative for pulmonary embolism. ____________________________________________   PROCEDURES  Procedure(s) performed: None  Procedures  Critical Care performed: No  ____________________________________________   INITIAL IMPRESSION / ASSESSMENT AND PLAN / ED COURSE  Pertinent labs & imaging results that were available during my care of the patient were reviewed by me and considered in my medical decision making (see chart for details).   Patient resents for concerns of elevated hyperglycemia.  Minimally decreased CO2.  No significantly elevated anion gap.  His creatinine is slightly increased and his GFR is slightly reduced, and I suspect likely he is  somewhat dehydrated.  He also has adrenal insufficiency, and given his presentation with some tachycardia slight shortness of breath will opt to give him a dose of IV hydrocortisone for stress dosing of steroids.  He denies infectious symptoms.  His blood sugar has improved here in the ER after home insulin treatments, and with hydration his blood sugar now less than 250 do not anticipate need to give additional insulin bolusing unless this continues to creep up but we will continue to follow it as we hydrate him gently.  His differential diagnosis for dyspnea would include pulmonary embolism, and given his associated known metastatic disease, progressively increasing shortness of breath I will obtain CT angiogram to exclude PE.  Discussed with the patient he is agreeable with this plan.  Denies acute cardiac symptoms such as chest pain.  Denies acute infectious symptoms.  White count normal.     ----------------------------------------- 11:16 PM on 07/20/2018 -----------------------------------------  CT negative for PE.  Patient heart rate improving with hydration, down to 99 at present.  Ongoing stress care signed Dr. Karma Greaser.  Patient's blood sugar has also improved with hydration.  Plan to hydrate the patient with additional fluids, with this patient continues to do well I anticipate he can be discharged on his blood sugars well controlled to follow-up closely with his primary doctor.  No signs or symptoms suggest acute infectious etiology at this time.  Cover test is pending as well as added fluid bolus.  ----------------------------------------- 11:29 PM on 07/20/2018 -----------------------------------------    Patient resting comfortably now.  His vital signs are all normal at this time, she reports some mild to moderate hypertension.  Resting comfortably.  Blood sugar now improved.  ____________________________________________   FINAL CLINICAL IMPRESSION(S) / ED DIAGNOSES  Final  diagnoses:  Hyperglycemia        Note:  This document was prepared using Dragon voice recognition software and may include unintentional dictation errors       Delman Kitten, MD 07/20/18 2330

## 2018-07-20 NOTE — ED Provider Notes (Signed)
-----------------------------------------   11:13 PM on 07/20/2018 -----------------------------------------   Assuming care from Dr. Jacqualine Code.  In short, Shawn Estrada is a 43 y.o. male with a chief complaint of hyperglycemia, etc.  Refer to the original H&P for additional details.  The current plan of care is to reassess after another 5105mL fluids.   ----------------------------------------- 1:53 AM on 07/21/2018 -----------------------------------------  The patient's blood sugar was in the 80s upon recheck but he was asymptomatic.  He ate a snack and says he feels fine.  He is completely asymptomatic and says he is ready to go.  His he has his close on and is all packed and ready.  He is pleasant, alert, denies pain, and does not appear to have any evidence of an emergent medical condition that would require further assessment or treatment.  I am discharging him for close follow-up with his oncologist at [facility] and gave my usual and customary return precautions.   Hinda Kehr, MD 07/21/18 [phone]

## 2018-07-20 NOTE — Progress Notes (Signed)
[place] Consult Note Telephone: [phone]  Fax: [phone]  PATIENT NAME: RORAN WEGNER DOB: July 25, 1975 MRN: [id number]  PRIMARY CARE PROVIDER:   Prince Solian, MD  REFERRING PROVIDER: Dr. Lawanna Kobus  RESPONSIBLE PARTY:   Self [phone]                                              [name], significant other, [phone]     RECOMMENDATIONS and PLAN:  1.  Renal cell carcinoma.  Patient was diagnosed in 2018 with renal cancer and has undergone surgical and pharmacological interventions.  He is currently on cabozantinib 40 mg daily.  Was told that this is his last treatment option with a prognosis of about 1 year.  He has mets to lungs and abdomen and will get intermittent left lower quadrant pain.  Since starting treatment patient has developed DMT1 which is being managed by an endocrinologist.  Does have problems controlling hyperglycemia.  Does state that he has recently developed increased fatigue and has to take frequent breaks while getting ready in the morning.  Does state that he sleeps a lot and some days will sleep all but 2-3 hours out of the day.  States that he has developed SOB even at rest that has developed over the past 2 weeks.  Patient is able to ambulate independently without assistive devices.  He is able to all ADLs.  No change in appetite and has not had any weight changes.   Did reach out to Dr. Iona Beard for prescription for ProAir or neb treatments for SOB.  Patient does state that it has been a while since he has seen his PCP as Dr. Iona Beard has been managing his condition.  2.  Goals of care.  Went over ACP in details and attempted to answer all their question.  Filled out DNR and MOST form, uploaded to [facility] and left originals with patient.  Patient wants significant other to be HCPOA and emailed patient attachment with [place] HCPOA forms for them to go over and fill out.    I spent 120 minutes providing  this consultation,  from 9:00 to 11:00. More than 50% of the time in this consultation was spent coordinating communication.   HISTORY OF PRESENT ILLNESS:  CHIRAG KRUEGER is a 43 y.o. year old male with multiple medical problems including renal cell carcinoma, DMT1, HTN, GERD, sleep apnea, adrenal insufficiency. Palliative Care was asked to help address goals of care.   CODE STATUS: DNR  PPS: 70% HOSPICE ELIGIBILITY/DIAGNOSIS: TBD  PHYSICAL EXAM  General:  Patient sitting on couch at home in NAD Extremities: No edema; no joint deformity Neuro:  Weakness; A&Ox3 but endorses forgetfulness at times  PAST MEDICAL HISTORY:  Past Medical History:  Diagnosis Date  . Adrenal insufficiency ([place])    secondary  . Cancer ([place])    kidney  . Cancer determined by renal biopsy ([place])   . Complication of anesthesia    slow to wake up  . Depression   . Diabetes mellitus without complication ([place])   . GERD (gastroesophageal reflux disease)   . Hypertension    No meds prescribed at this time  . PONV (postoperative nausea and vomiting)   . Sleep apnea    cpap    SOCIAL HX:  Social History   Tobacco Use  .  Smoking status: Current Some Day Smoker    Packs/day: 0.25    Years: 26.00    Pack years: 6.50    Types: Cigarettes    Start date: 10/23/1989  . Smokeless tobacco: Current User    Types: Snuff  . Tobacco comment: smokes about 10 cigarettes every 2 weeks.   Substance Use Topics  . Alcohol use: No    Alcohol/week: 0.0 - 7.0 standard drinks    Comment: Occasional use    ALLERGIES:  Allergies  Allergen Reactions  . Nivolumab Cough    Tolerates well with Benadryl pre-med.   . Lisinopril Cough  . Nsaids Other (See Comments)    Cant take nsaids due to having only 1 kidney  . Tolmetin Other (See Comments)    Cant take nsaids due to having only 1 kidney     PERTINENT MEDICATIONS:  Outpatient Encounter Medications as of 07/20/2018  Medication Sig  . alum & mag hydroxide-simeth  (MAALOX/MYLANTA) 200-200-20 MG/5ML suspension Take 15 mLs by mouth every 6 (six) hours as needed for indigestion or heartburn.  . cabozantinib (CABOMETYX) 40 MG tablet Take 40 mg by mouth daily. Take on an empty stomach, 1 hour before or 2 hours after meals.  . calcium carbonate (TUMS - DOSED IN MG ELEMENTAL CALCIUM) 500 MG chewable tablet Chew 1 tablet by mouth 2 (two) times daily as needed for indigestion or heartburn.  . diphenoxylate-atropine (LOMOTIL) 2.5-0.025 MG tablet Take 1-2 tablets by mouth 4 (four) times daily.  Marland Kitchen escitalopram (LEXAPRO) 5 MG tablet Take 10 mg by mouth.   . famotidine (PEPCID) 40 MG tablet Take 40 mg by mouth 2 (two) times daily.  Marland Kitchen HUMALOG KWIKPEN 100 UNIT/ML KiwkPen Inject 20 Units into the skin 3 (three) times daily with meals. 28 with breakfast, 28 with lunch, 24 with dinner plus correction scale with each meal over 150  . hydrocortisone (CORTEF) 5 MG tablet Take 10-25 mg by mouth 2 (two) times daily. 20 mg at 8 am and 20 mg at 2 pm. Sick day rules to double dose  . loperamide (IMODIUM) 2 MG capsule Take 2 mg by mouth 4 (four) times daily.  Marland Kitchen losartan (COZAAR) 25 MG tablet Take 100 mg by mouth daily.   . ondansetron (ZOFRAN) 4 MG tablet Take 4 mg by mouth every 8 (eight) hours as needed for nausea or vomiting.  . sucralfate (CARAFATE) 1 g tablet Take 1 g by mouth as needed.  . TRESIBA FLEXTOUCH 100 UNIT/ML SOPN FlexTouch Pen Inject 52 Units into the skin daily.   . TRULICITY 1.5 OF/7.5ZW SOPN Inject 1.5 mg into the skin once a week.   No facility-administered encounter medications on file as of 07/20/2018.       Deaundra Kutzer Jenetta Downer, NP

## 2018-07-20 NOTE — ED Triage Notes (Addendum)
Patient c/o hyperglycemia - 2 episodes of CBGs > 600. Patient c/o SOB. Patient is a cancer patient, currently on chemo. Patient has given himself 150 units of Novolog today.

## 2018-07-21 LAB — SARS CORONAVIRUS 2 BY RT PCR (HOSPITAL ORDER, PERFORMED IN ~~LOC~~ HOSPITAL LAB): SARS Coronavirus 2: NEGATIVE

## 2018-07-21 LAB — GLUCOSE, CAPILLARY: Glucose-Capillary: 84 mg/dL (ref 70–99)

## 2018-07-21 NOTE — Discharge Instructions (Addendum)
Your workup in the Emergency Department today was reassuring.  We did not find any specific abnormalities.  We recommend you drink plenty of fluids, take your regular medications and/or any new ones prescribed today, and follow up with the doctor(s) listed in these documents as recommended.  Return to the Emergency Department if you develop new or worsening symptoms that concern you.  

## 2018-07-21 NOTE — ED Notes (Signed)
Pt eating snack he brought from home

## 2018-08-02 ENCOUNTER — Other Ambulatory Visit: Payer: Self-pay

## 2018-08-02 ENCOUNTER — Other Ambulatory Visit: Payer: PRIVATE HEALTH INSURANCE | Admitting: Adult Health Nurse Practitioner

## 2018-08-02 DIAGNOSIS — Z515 Encounter for palliative care: Secondary | ICD-10-CM

## 2018-08-02 NOTE — Progress Notes (Signed)
[facility] Consult Note Telephone: [phone]  Fax: [phone]  PATIENT NAME: Shawn Estrada DOB: 1975-08-04 MRN: [id number]  PRIMARY CARE PROVIDER:   Prince Solian, MD  REFERRING PROVIDER: Dr. Lawanna Kobus  RESPONSIBLE PARTY:   Self [phone]                                              [name], significant other, [phone]     RECOMMENDATIONS and PLAN:  1.  Renal cell carcinoma.  Patient was diagnosed in 2018 with renal cancer and has undergone surgical and pharmacological interventions.  He is currently on cabozantinib 40 mg daily.  Was told that this is his last treatment option with a prognosis of about 1 year.  He has mets to lungs and abdomen and will get intermittent left lower quadrant pain.  After notifying Dr. Iona Beard about his SOB he was instructed to go to the hospital and they did not find any reason for the SOB.  Patient states that his blood sugar was high that day and they were able to bring it down when he was at the hospital.  He still has SOB especially with activity.  States that he has not been wearing his CPAPdue to mouth sores that have developed from the chemo.  He is on medication for this.  Did instruct that once the mouth sores heal if he wore the CPAP more consistently it might help with the SOB and weakness.    2.  Goals of care.  Patient is a DNR.  Wants to be able to enjoy life with his loved ones as much as possible before he is unable to.  Has plans to spend next month at the beach.  Other than the mouth sores patient has no new concerns.  Have another appointment when he returns from the beach.  Of note patient does seem pale today but he does not endorse any increased fatigue or weakness.  His significant other does state that he looked even paler yesterday and looks better today.  Had sent them a link for filling out HCPOA and they have not filled this out yet but state they plan to soon.   I spent 40 minutes providing this consultation,  from 2:30 to 3:10. More than 50% of the time in this consultation was spent coordinating communication.   HISTORY OF PRESENT ILLNESS:  Shawn Estrada is a 43 y.o. year old male with multiple medical problems including renal cell carcinoma, DMT1, HTN, GERD, sleep apnea, adrenal insufficiency. Palliative Care was asked to help address goals of care.   CODE STATUS: DNR  PPS: 70% HOSPICE ELIGIBILITY/DIAGNOSIS: TBD  PHYSICAL EXAM:   General: Patient sitting on couch I NAD Cardiovascular: regular rate and rhythm Pulmonary: lung sounds clear; normal respiratory effort Skin: no rashes Neurological: Weakness but otherwise nonfocal  PAST MEDICAL HISTORY:  Past Medical History:  Diagnosis Date  . Adrenal insufficiency ([place])    secondary  . Cancer ([place])    kidney  . Cancer determined by renal biopsy ([place])   . Complication of anesthesia    slow to wake up  . Depression   . Diabetes mellitus without complication ([place])   . GERD (gastroesophageal reflux disease)   . Hypertension    No meds prescribed at this time  . PONV (postoperative nausea and vomiting)   .  Sleep apnea    cpap    SOCIAL HX:  Social History   Tobacco Use  . Smoking status: Current Some Day Smoker    Packs/day: 0.25    Years: 26.00    Pack years: 6.50    Types: Cigarettes    Start date: 10/23/1989  . Smokeless tobacco: Current User    Types: Snuff  . Tobacco comment: smokes about 10 cigarettes every 2 weeks.   Substance Use Topics  . Alcohol use: No    Alcohol/week: 0.0 - 7.0 standard drinks    Comment: Occasional use    ALLERGIES:  Allergies  Allergen Reactions  . Nivolumab Cough    Tolerates well with Benadryl pre-med.   . Lisinopril Cough  . Nsaids Other (See Comments)    Cant take nsaids due to having only 1 kidney  . Tolmetin Other (See Comments)    Cant take nsaids due to having only 1 kidney     PERTINENT MEDICATIONS:  Outpatient Encounter  Medications as of 08/02/2018  Medication Sig  . alum & mag hydroxide-simeth (MAALOX/MYLANTA) 200-200-20 MG/5ML suspension Take 15 mLs by mouth every 6 (six) hours as needed for indigestion or heartburn.  . cabozantinib (CABOMETYX) 40 MG tablet Take 40 mg by mouth daily. Take on an empty stomach, 1 hour before or 2 hours after meals.  . calcium carbonate (TUMS - DOSED IN MG ELEMENTAL CALCIUM) 500 MG chewable tablet Chew 1 tablet by mouth 2 (two) times daily as needed for indigestion or heartburn.  . diphenoxylate-atropine (LOMOTIL) 2.5-0.025 MG tablet Take 1-2 tablets by mouth 4 (four) times daily.  Marland Kitchen escitalopram (LEXAPRO) 5 MG tablet Take 10 mg by mouth.   . famotidine (PEPCID) 40 MG tablet Take 40 mg by mouth 2 (two) times daily.  Marland Kitchen HUMALOG KWIKPEN 100 UNIT/ML KiwkPen Inject 20 Units into the skin 3 (three) times daily with meals. 28 with breakfast, 28 with lunch, 24 with dinner plus correction scale with each meal over 150  . hydrocortisone (CORTEF) 5 MG tablet Take 10-25 mg by mouth 2 (two) times daily. 20 mg at 8 am and 20 mg at 2 pm. Sick day rules to double dose  . loperamide (IMODIUM) 2 MG capsule Take 2 mg by mouth 4 (four) times daily.  Marland Kitchen losartan (COZAAR) 25 MG tablet Take 100 mg by mouth daily.   . ondansetron (ZOFRAN) 4 MG tablet Take 4 mg by mouth every 8 (eight) hours as needed for nausea or vomiting.  . sucralfate (CARAFATE) 1 g tablet Take 1 g by mouth as needed.  . TRESIBA FLEXTOUCH 100 UNIT/ML SOPN FlexTouch Pen Inject 52 Units into the skin daily.   . TRULICITY 1.5 QI/2.9NL SOPN Inject 1.5 mg into the skin once a week.   No facility-administered encounter medications on file as of 08/02/2018.       Sharlette Jansma Jenetta Downer, NP

## 2018-09-25 ENCOUNTER — Telehealth: Payer: Self-pay | Admitting: Adult Health Nurse Practitioner

## 2018-09-25 NOTE — Telephone Encounter (Signed)
Melissa had left VM about patient not feeling well and needing to cancel today's appointment.  I called her back and she is going to talk with patient and call me back with a time to reschedule. Chadwin Fury K. Olena Heckle NP

## 2018-10-22 ENCOUNTER — Emergency Department
Admission: EM | Admit: 2018-10-22 | Discharge: 2018-10-23 | Disposition: A | Discharge status: Home / Self Care | Payer: PRIVATE HEALTH INSURANCE | Attending: Emergency Medicine | Admitting: Emergency Medicine

## 2018-10-22 ENCOUNTER — Other Ambulatory Visit: Payer: Self-pay

## 2018-10-22 ENCOUNTER — Telehealth: Payer: Self-pay | Admitting: Adult Health Nurse Practitioner

## 2018-10-22 DIAGNOSIS — Z79899 Other long term (current) drug therapy: Secondary | ICD-10-CM | POA: Diagnosis not present

## 2018-10-22 DIAGNOSIS — E860 Dehydration: Secondary | ICD-10-CM | POA: Diagnosis not present

## 2018-10-22 DIAGNOSIS — Z794 Long term (current) use of insulin: Secondary | ICD-10-CM | POA: Diagnosis not present

## 2018-10-22 DIAGNOSIS — F17210 Nicotine dependence, cigarettes, uncomplicated: Secondary | ICD-10-CM | POA: Diagnosis not present

## 2018-10-22 DIAGNOSIS — Z85528 Personal history of other malignant neoplasm of kidney: Secondary | ICD-10-CM | POA: Diagnosis not present

## 2018-10-22 DIAGNOSIS — I10 Essential (primary) hypertension: Secondary | ICD-10-CM | POA: Diagnosis not present

## 2018-10-22 DIAGNOSIS — R531 Weakness: Secondary | ICD-10-CM | POA: Diagnosis present

## 2018-10-22 DIAGNOSIS — E119 Type 2 diabetes mellitus without complications: Secondary | ICD-10-CM | POA: Insufficient documentation

## 2018-10-22 DIAGNOSIS — R109 Unspecified abdominal pain: Secondary | ICD-10-CM | POA: Diagnosis not present

## 2018-10-22 DIAGNOSIS — R197 Diarrhea, unspecified: Secondary | ICD-10-CM | POA: Insufficient documentation

## 2018-10-22 DIAGNOSIS — R5383 Other fatigue: Secondary | ICD-10-CM | POA: Insufficient documentation

## 2018-10-22 LAB — COMPREHENSIVE METABOLIC PANEL
ALT: 51 U/L — ABNORMAL HIGH (ref 0–44)
AST: 34 U/L (ref 15–41)
Albumin: 4.3 g/dL (ref 3.5–5.0)
Alkaline Phosphatase: 58 U/L (ref 38–126)
Anion gap: 10 (ref 5–15)
BUN: 12 mg/dL (ref 6–20)
CO2: 20 mmol/L — ABNORMAL LOW (ref 22–32)
Calcium: 9.2 mg/dL (ref 8.9–10.3)
Chloride: 103 mmol/L (ref 98–111)
Creatinine, Ser: 1.33 mg/dL — ABNORMAL HIGH (ref 0.61–1.24)
GFR calc Af Amer: >60 mL/min (ref >60)
GFR calc non Af Amer: >60 mL/min (ref >60)
Glucose, Bld: 301 mg/dL — ABNORMAL HIGH (ref 70–99)
Potassium: 3.6 mmol/L (ref 3.5–5.1)
Sodium: 133 mmol/L — ABNORMAL LOW (ref 135–145)
Total Bilirubin: 0.7 mg/dL (ref 0.3–1.2)
Total Protein: 8.3 g/dL — ABNORMAL HIGH (ref 6.5–8.1)

## 2018-10-22 LAB — CBC
HCT: 48.5 % (ref 39.0–52.0)
Hemoglobin: 16.8 g/dL (ref 13.0–17.0)
MCH: 30.6 pg (ref 26.0–34.0)
MCHC: 34.6 g/dL (ref 30.0–36.0)
MCV: 88.3 fL (ref 80.0–100.0)
Platelets: 364 10*3/uL (ref 150–400)
RBC: 5.49 MIL/uL (ref 4.22–5.81)
RDW: 12.7 % (ref 11.5–15.5)
WBC: 7.3 10*3/uL (ref 4.0–10.5)
nRBC: 0 % (ref 0.0–0.2)

## 2018-10-22 LAB — URINALYSIS, COMPLETE (UACMP) WITH MICROSCOPIC
Bacteria, UA: NONE SEEN
Bilirubin Urine: NEGATIVE
Glucose, UA: >=500 mg/dL — AB
Hgb urine dipstick: NEGATIVE
Ketones, ur: 5 mg/dL — AB
Nitrite: NEGATIVE
Protein, ur: NEGATIVE mg/dL
Specific Gravity, Urine: 1.026 (ref 1.005–1.030)
Squamous Epithelial / HPF: NONE SEEN (ref 0–5)
pH: 5 (ref 5.0–8.0)

## 2018-10-22 LAB — LIPASE, BLOOD: Lipase: 30 U/L (ref 11–51)

## 2018-10-22 MED ORDER — SODIUM CHLORIDE 0.9 % IV BOLUS
1000.0000 mL | Freq: Once | INTRAVENOUS | Status: AC
  Administered 2018-10-22: 1000 mL via INTRAVENOUS

## 2018-10-22 MED ORDER — SODIUM CHLORIDE 0.9 % IV BOLUS
1000.0000 mL | Freq: Once | INTRAVENOUS | Status: AC
  Administered 2018-10-22: 23:00:00 1000 mL via INTRAVENOUS

## 2018-10-22 NOTE — ED Provider Notes (Signed)
[facility] Emergency Department Provider Note  ____________________________________________   I have reviewed the triage vital signs and the nursing notes.   HISTORY  Chief Complaint Fatigue  History limited by: Not Limited   HPI Shawn Estrada is a 43 y.o. male who presents to the emergency department today because of concerns for fatigue and weakness over the past 10 days.  Patient has a history of cancer and as part of his chemotherapy states he has had some chronic diarrhea.  However over the past 10 days it is gotten worse.  He has noticed that he has had increased fatigue over this time.  Family and patient has concerns for possible dehydration.  Patient states that his abdominal pain is actually fairly well controlled over this.  He has only required a couple doses of his pain medications.  Denies any fevers.  Records reviewed. Per medical record review patient has a history of renal cancer.   Past Medical History:  Diagnosis Date  . Adrenal insufficiency ([place])    secondary  . Cancer ([place])    kidney  . Cancer determined by renal biopsy ([place])   . Complication of anesthesia    slow to wake up  . Depression   . Diabetes mellitus without complication ([place])   . GERD (gastroesophageal reflux disease)   . Hypertension    No meds prescribed at this time  . PONV (postoperative nausea and vomiting)   . Sleep apnea    cpap    Patient Active Problem List   Diagnosis Date Noted  . Kidney cancer, primary, with metastasis from kidney to other site [facility]) 09/26/2016  . Goals of care, counseling/discussion 09/26/2016  . Primary malignant neoplasm of kidney with metastasis from kidney to other site [facility]) 09/26/2016  . Right renal mass 03/31/2016    Past Surgical History:  Procedure Laterality Date  . CHOLECYSTECTOMY    . LAPAROSCOPIC NEPHRECTOMY Right 03/31/2016   Procedure: RIGHT LAPAROSCOPIC RADICAL NEPHRECTOMY;  Surgeon: Ardis Hughs, MD;   Location: [facility] ORS;  Service: Urology;  Laterality: Right;    Prior to Admission medications   Medication Sig Start Date End Date Taking? Authorizing Provider  alum & mag hydroxide-simeth (MAALOX/MYLANTA) 200-200-20 MG/5ML suspension Take 15 mLs by mouth every 6 (six) hours as needed for indigestion or heartburn.    [provider]  cabozantinib (CABOMETYX) 40 MG tablet Take 40 mg by mouth daily. Take on an empty stomach, 1 hour before or 2 hours after meals.    [provider]  calcium carbonate (TUMS - DOSED IN MG ELEMENTAL CALCIUM) 500 MG chewable tablet Chew 1 tablet by mouth 2 (two) times daily as needed for indigestion or heartburn.    [provider]  diphenoxylate-atropine (LOMOTIL) 2.5-0.025 MG tablet Take 1-2 tablets by mouth 4 (four) times daily.    [provider]  escitalopram (LEXAPRO) 5 MG tablet Take 10 mg by mouth.     [provider]  famotidine (PEPCID) 40 MG tablet Take 40 mg by mouth 2 (two) times daily.    [provider]  HUMALOG KWIKPEN 100 UNIT/ML KiwkPen Inject 20 Units into the skin 3 (three) times daily with meals. 28 with breakfast, 28 with lunch, 24 with dinner plus correction scale with each meal over 150    [provider]  hydrocortisone (CORTEF) 5 MG tablet Take 10-25 mg by mouth 2 (two) times daily. 20 mg at 8 am and 20 mg at 2 pm. Sick day rules  to double dose    [provider]  loperamide (IMODIUM) 2 MG capsule Take 2 mg by mouth 4 (four) times daily.    [provider]  losartan (COZAAR) 25 MG tablet Take 100 mg by mouth daily.     [provider]  ondansetron (ZOFRAN) 4 MG tablet Take 4 mg by mouth every 8 (eight) hours as needed for nausea or vomiting.    [provider]  sucralfate (CARAFATE) 1 g tablet Take 1 g by mouth as needed.    [provider]  TRESIBA FLEXTOUCH 100 UNIT/ML SOPN FlexTouch Pen Inject 52 Units into the skin daily.     [provider]  TRULICITY 1.5 0000000 SOPN Inject 1.5 mg into the skin once a week.    [provider]    Allergies Nivolumab, Lisinopril, Nsaids, and Tolmetin  Family History  Problem Relation Age of Onset  . Depression Sister     Social History Social History   Tobacco Use  . Smoking status: Current Some Day Smoker    Packs/day: 0.25    Years: 26.00    Pack years: 6.50    Types: Cigarettes    Start date: 10/23/1989  . Smokeless tobacco: Current User    Types: Snuff  . Tobacco comment: smokes about 10 cigarettes every 2 weeks.   Substance Use Topics  . Alcohol use: No    Alcohol/week: 0.0 - 7.0 standard drinks    Comment: Occasional use  . Drug use: No    Review of Systems Constitutional: No fever/chills Eyes: No visual changes. ENT: No sore throat. Cardiovascular: Denies chest pain. Respiratory: Denies shortness of breath. Gastrointestinal: Positive for occasional abdominal pain. Positive for diarrhea.  Genitourinary: Negative for dysuria. Musculoskeletal: Negative for back pain. Skin: Negative for rash. Neurological: Negative for headaches, focal weakness or numbness.  ____________________________________________   PHYSICAL EXAM:  VITAL SIGNS: ED Triage Vitals  Enc Vitals Group     BP 10/22/18 1910 (!) 141/99     Pulse Rate 10/22/18 1910 (!) 124     Resp 10/22/18 1910 20     Temp 10/22/18 1910 98.6 F (37 C)     Temp Source 10/22/18 1910 Oral     SpO2 10/22/18 1910 98 %     Weight 10/22/18 1911 265 lb (120.2 kg)     Height --      Head Circumference --      Peak Flow --      Pain Score 10/22/18 1911 0   Constitutional: Alert and oriented.  Eyes: Conjunctivae are normal.  ENT      Head: Normocephalic and atraumatic.      Nose: No congestion/rhinnorhea.      Mouth/Throat: Mucous membranes are moist.      Neck: No stridor. Hematological/Lymphatic/Immunilogical: No cervical lymphadenopathy. Cardiovascular: Tachycardia, regular rhythm.   No murmurs, rubs, or gallops.  Respiratory: Normal respiratory effort without tachypnea nor retractions. Breath sounds are clear and equal bilaterally. No wheezes/rales/rhonchi. Gastrointestinal: Soft and non tender. No rebound. No guarding.  Genitourinary: Deferred Musculoskeletal: Normal range of motion in all extremities. No lower extremity edema. Neurologic:  Normal speech and language. No gross focal neurologic deficits are appreciated.  Skin:  Skin is warm, dry and intact. No rash noted. Psychiatric: Mood and affect are normal. Speech and behavior are normal. Patient exhibits appropriate insight and judgment.  ____________________________________________    LABS (pertinent positives/negatives)  Lipase 30 CBC wbc 7.3, hgb 16.8, plt 364 CMP na 133, k  3.6, glu 301, cr 1.33 UA clear, ketones 5, leukocytes small, wbc and rbc 0-5  ____________________________________________   EKG  None  ____________________________________________    RADIOLOGY  None  ____________________________________________   PROCEDURES  Procedures  ____________________________________________   INITIAL IMPRESSION / ASSESSMENT AND PLAN / ED COURSE  Pertinent labs & imaging results that were available during my care of the patient were reviewed by me and considered in my medical decision making (see chart for details).   Patient presents to the emergency department today because of concerns for fatigue and weakness.  Patient does have diarrhea which is somewhat chronic for him.  Has been worse for the past 10 days. Initially patient was tachycardic. Blood work without concerning. Did feel better after IV fluids. Do think likely patient's symptoms secondary to dehydration.   ____________________________________________   FINAL CLINICAL IMPRESSION(S) / ED DIAGNOSES  Final diagnoses:  Dehydration  Weakness     Note: This dictation was prepared with Dragon dictation. Any transcriptional  errors that result from this process are unintentional     Nance Pear, MD 10/22/18 2251

## 2018-10-22 NOTE — ED Triage Notes (Signed)
Pt in with co feeling weak and tired for over a week, states does have generalized abd pain. Also has had diarrhea for 10 days x 2 today. Pt co nausea no vomiting.

## 2018-10-22 NOTE — Telephone Encounter (Signed)
Called to set up follow up appointment. Left VM with contact info Amy K. Olena Heckle NP

## 2018-10-22 NOTE — Discharge Instructions (Signed)
Please seek medical attention for any high fevers, chest pain, shortness of breath, change in behavior, persistent vomiting, bloody stool or any other new or concerning symptoms.  

## 2018-10-23 LAB — GLUCOSE, CAPILLARY
Glucose-Capillary: 149 mg/dL — ABNORMAL HIGH (ref 70–99)
Glucose-Capillary: 46 mg/dL — ABNORMAL LOW (ref 70–99)

## 2018-10-23 NOTE — ED Notes (Signed)
Pt provided with orange juice and graham crackers with peanut butter.

## 2018-10-23 NOTE — ED Provider Notes (Signed)
1:40 AM Assumed care for off going team.   Blood pressure (!) 151/92, pulse 91, temperature 98.6 F (37 C), temperature source Oral, resp. rate (!) 21, weight 120.2 kg, SpO2 98 %.  See their HPI for full report but in brief  Plan to d/c after fluids. Renal cancer, chemo, chronic diarrhea.   Pt feels much better after fluid and requesting d/c home. No other questions/ .  I discussed the provisional nature of ED diagnosis, the treatment so far, the ongoing plan of care, follow up appointments and return precautions with the patient and any family or support people present. They expressed understanding and agreed with the plan, discharged home.           Vanessa [place], MD 10/23/18 [phone]

## 2018-12-02 IMAGING — DX DG ORBITS FOR FOREIGN BODY
2 series · 2 of 2 positions shown · non-contrast
Comparison: None.

CLINICAL DATA: Metal working/exposure; clearance prior to MRI

EXAM:
ORBITS FOR FOREIGN BODY - 2 VIEW

[orbits waters (1 of 2)]
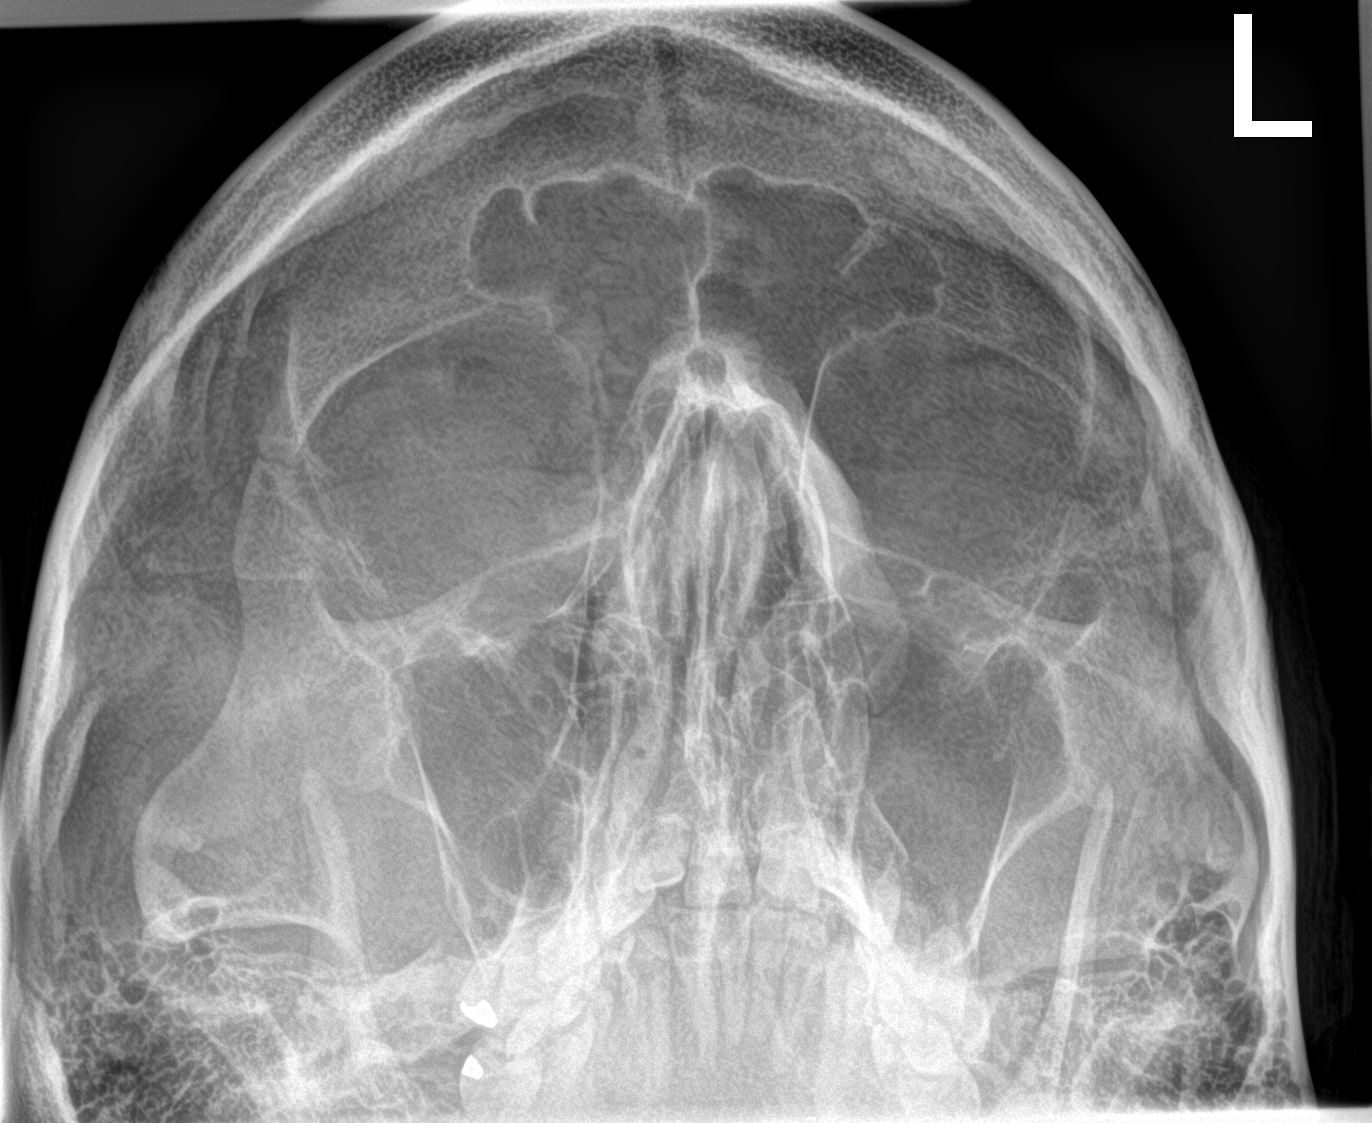

[orbits waters (2 of 2)]
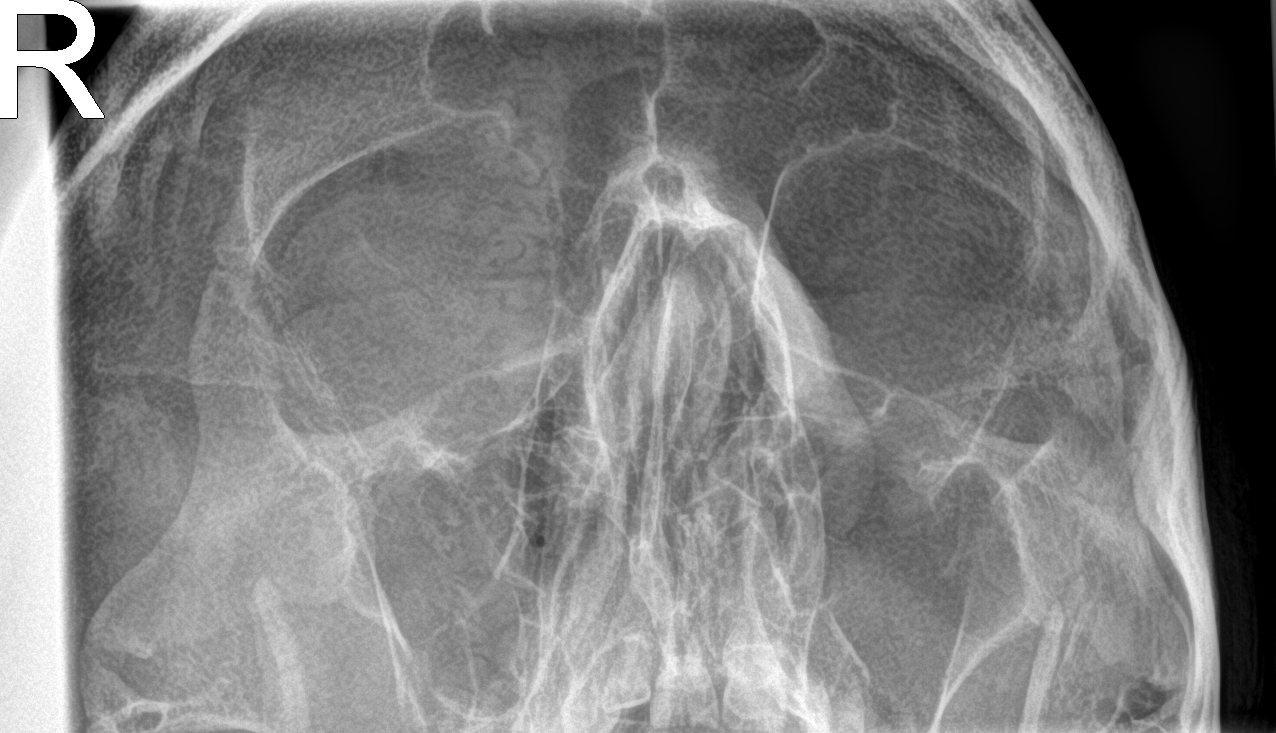

[2 of 2 positions shown; findings below may reference images not displayed]

FINDINGS: There is no evidence of metallic foreign body within the orbits. No
significant bone abnormality identified. Visualized sinuses appear
clear well aerated. Radiopaque dental hardware noted.
IMPRESSION: No evidence of metallic foreign body within the orbits.

## 2018-12-28 ENCOUNTER — Telehealth: Payer: Self-pay | Admitting: Adult Health Nurse Practitioner

## 2018-12-28 NOTE — Telephone Encounter (Signed)
Called to schedule appointment.  Left VM with reason for call and call back info Genevie Elman K. Ellah Otte NP 

## 2019-02-01 ENCOUNTER — Telehealth: Payer: Self-pay | Admitting: Adult Health Nurse Practitioner

## 2019-02-01 NOTE — Telephone Encounter (Signed)
Returned fiancee's VM to set up appointment.  Set up appointment for 02/11/2019 at 2:30pm. Cottrell Gentles K. Olena Heckle NP

## 2019-02-11 ENCOUNTER — Other Ambulatory Visit: Payer: Self-pay

## 2019-02-11 ENCOUNTER — Other Ambulatory Visit: Payer: PRIVATE HEALTH INSURANCE | Admitting: Adult Health Nurse Practitioner

## 2019-02-11 DIAGNOSIS — C649 Malignant neoplasm of unspecified kidney, except renal pelvis: Secondary | ICD-10-CM

## 2019-02-11 DIAGNOSIS — Z515 Encounter for palliative care: Secondary | ICD-10-CM

## 2019-02-11 NOTE — Progress Notes (Signed)
[place] Consult Note Telephone: [phone]  Fax: [phone]  PATIENT NAME: Shawn Estrada DOB: July 23, 1975 MRN: [id number]  PRIMARY CARE PROVIDER:   Prince Solian, MD  REFERRING PROVIDER:  Dr. Lawanna Estrada  RESPONSIBLE PARTY:    Self [phone] Shawn Estrada, significant other, [phone]    RECOMMENDATIONS and PLAN:  1.  Advanced care planning.  Patient is a DNR.  Discussed with patient and fiancee filling out HCPOA.  Encouraged to complete this and get it notarized.  Patient states that [facility] offers free legal services through [facility] for terminal patients and encouraged to look into this. Patient aware of his prognosis and has been making sure his affairs are in order, such funeral arrangements and advanced directives.  2.  Renal cell carcinoma with mets.  Patient had scan in November that showed progression of the cancer with increased mets to duodenum, pancreas, liver, and peritoneum of the heart.  Was started on Divo and Cabometyx.  States that he really doesn't feel better.  States that he has progressively gotten worse and sleeps more.  Sleeps about 50% of the time.  He feels weak and tires easily and gets DOE. Does get occasional nausea that is relieved with zofran. Has CPAP which he has not used in several months due to having mouth sores.  No longer has mouth sores and is encouraged to start using it again to see if this might help with the fatigue and weakness.  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  Have encouraged looking into pressure relieving mattress to help prevent further pressure injury as he is in bed more.  Considering how functional and continent he is, he would not qualify for insurance to cover an air mattress.  Has scan on 02/20/2019.  Continue follow up and recommendations by  oncology.  Have appointment on 02/25/2019 to go over scan results and options based on results.  3.  Functional status.  Patient is ambulatory without assistive devices.  Denies falls. Is continent of B&B. Independent with ADLs.  Tires easily and is encouraged to take frequent rest breaks.    See above for recommendations on restarting his CPAP  4. Pain.  Patient does get recurrent pain on left side.  Has dilaudid for this but tries not to take it too much because it makes him sleepy.  States that most of the time the pain is 1-4/10 but that he will take the dilaudid when it is 7-8/10.  Continue current pain regimen as this is giving him relief when he needs it.    5.  Nutrition.  Patient currently weighs 268 pounds.  States that he has been gaining weight with the steroids he is on for his chemo.  Has sliding scale insulin for his diabetes.  States that sometimes his blood sugar will go high but lately it has been better managed.  Continue frequent monitoring and use of sliding scale insulin to managed diabetes.  Palliative care will continue to monitor for symptom management/decline and make recommendations as needed.  I spent 60 minutes providing this consultation, including time with patient/family, chart review, provider coordination, and documentation . More than 50% of the time in this consultation was spent coordinating communication.   HISTORY OF PRESENT ILLNESS:  Shawn Estrada is a 44 y.o. year old male with multiple medical problems including renal cell carcinoma, DMT1, HTN, GERD, sleep  apnea, adrenal insufficiency. Palliative Care was asked to help address goals of care.   CODE STATUS: DNR  PPS: 70% HOSPICE ELIGIBILITY/DIAGNOSIS: TBD  PHYSICAL EXAM:   General: patient at home in NAD Extremities: no edema, no joint deformities Skin: no rashes on exposed skin Neurological: Weakness but otherwise nonfocal   PAST MEDICAL HISTORY:  Past Medical History:  Diagnosis Date   Adrenal  insufficiency ([place])    secondary   Cancer ([place])    kidney   Cancer determined by renal biopsy ([facility])    Complication of anesthesia    slow to wake up   Depression    Diabetes mellitus without complication ([facility])    GERD (gastroesophageal reflux disease)    Hypertension    No meds prescribed at this time   PONV (postoperative nausea and vomiting)    Sleep apnea    cpap    SOCIAL HX:  Social History   Tobacco Use   Smoking status: Current Some Day Smoker    Packs/day: 0.25    Years: 26.00    Pack years: 6.50    Types: Cigarettes    Start date: 10/23/1989   Smokeless tobacco: Current User    Types: Snuff   Tobacco comment: smokes about 10 cigarettes every 2 weeks.   Substance Use Topics   Alcohol use: No    Alcohol/week: 0.0 - 7.0 standard drinks    Comment: Occasional use    ALLERGIES:  Allergies  Allergen Reactions   Nivolumab Cough    Tolerates well with Benadryl pre-med.    Lisinopril Cough   Nsaids Other (See Comments)    Cant take nsaids due to having only 1 kidney   Tolmetin Other (See Comments)    Cant take nsaids due to having only 1 kidney     PERTINENT MEDICATIONS:  Outpatient Encounter Medications as of 02/11/2019  Medication Sig   alum & mag hydroxide-simeth (MAALOX/MYLANTA) 200-200-20 MG/5ML suspension Take 15 mLs by mouth every 6 (six) hours as needed for indigestion or heartburn.   cabozantinib (CABOMETYX) 40 MG tablet Take 40 mg by mouth daily. Take on an empty stomach, 1 hour before or 2 hours after meals.   calcium carbonate (TUMS - DOSED IN MG ELEMENTAL CALCIUM) 500 MG chewable tablet Chew 1 tablet by mouth 2 (two) times daily as needed for indigestion or heartburn.   diphenoxylate-atropine (LOMOTIL) 2.5-0.025 MG tablet Take 1-2 tablets by mouth 4 (four) times daily.   escitalopram (LEXAPRO) 5 MG tablet Take 10 mg by mouth.    famotidine (PEPCID) 40 MG tablet Take 40 mg by mouth 2 (two) times daily.   HUMALOG KWIKPEN 100  UNIT/ML KiwkPen Inject 20 Units into the skin 3 (three) times daily with meals. 28 with breakfast, 28 with lunch, 24 with dinner plus correction scale with each meal over 150   hydrocortisone (CORTEF) 5 MG tablet Take 10-25 mg by mouth 2 (two) times daily. 20 mg at 8 am and 20 mg at 2 pm. Sick day rules to double dose   loperamide (IMODIUM) 2 MG capsule Take 2 mg by mouth 4 (four) times daily.   losartan (COZAAR) 25 MG tablet Take 100 mg by mouth daily.    ondansetron (ZOFRAN) 4 MG tablet Take 4 mg by mouth every 8 (eight) hours as needed for nausea or vomiting.   sucralfate (CARAFATE) 1 g tablet Take 1 g by mouth as needed.   TRESIBA FLEXTOUCH 100 UNIT/ML SOPN FlexTouch Pen Inject 52 Units into the skin daily.  TRULICITY 1.5 0000000 SOPN Inject 1.5 mg into the skin once a week.   No facility-administered encounter medications on file as of 02/11/2019.     Oaklie Durrett Jenetta Downer, NP

## 2019-02-20 ENCOUNTER — Telehealth: Payer: Self-pay

## 2019-02-20 NOTE — Telephone Encounter (Signed)
Telephone call to patient to schedule palliative care visit with patient. Patient/family in agreement with TELEPHONIC visit on 02-21-19 at 11:30AM.

## 2019-02-21 ENCOUNTER — Other Ambulatory Visit: Payer: Self-pay

## 2019-02-21 ENCOUNTER — Other Ambulatory Visit: Payer: PRIVATE HEALTH INSURANCE

## 2019-02-21 DIAGNOSIS — Z515 Encounter for palliative care: Secondary | ICD-10-CM

## 2019-02-21 NOTE — Progress Notes (Signed)
COMMUNITY PALLIATIVE CARE SW NOTE  PATIENT NAME: Shawn Estrada DOB: [birth year] MRN: [id number]  PRIMARY CARE PROVIDER: Prince Solian, MD  RESPONSIBLE PARTY:  Acct ID - Guarantor Home Phone Work Phone Relationship Acct Type  [id number] - Luviano,[phone]  Self P/F     [address], [place], [place] [address]     PLAN OF CARE and INTERVENTIONS:             GOALS OF CARE/ ADVANCE CARE PLANNING:  Patient is DNR, form is in the home. MOST form is complete. HCPOA is Melissa. Goal is to continue improving - patient wants to maintain quality of life.  SOCIAL/EMOTIONAL/SPIRITUAL ASSESSMENT/ INTERVENTIONS:  SW completed TELEHEALTH visit with patient and Melissa (patient's fiance). Melissa provided history. Patient was diagnosed with cancer three years ago. Patient is currently taking daily chemo pill at home, and goes every four weeks for immunotherapy. Patient goes to [facility] for treatment. Patient was also diagnosed with diabetes following his cancer diagnosis and has had difficulty managing this. Patient checks his blood sugar 4-5 times a day. Melissa tries to be attentive to his blood sugar and diet. Patient is mostly independent but is having increased fatigue. Patient and Lenna Sciara live together in the home. Patient and Lenna Sciara have been together for about 8 years and are now engaged. Patient was working at his job until January 2019 and was unable to continue due to his condition. Patient has no children, but has a mother and sister that are local, and involved. SW provided education on palliative care, discussed resources, provided emotional support and validated patient and Melissa's feelings and concerns.  PATIENT/CAREGIVER EDUCATION/ COPING:  Patient was alert, oriented. Patient openly expressed feelings. Patient becomes frustrated with complexity of the medical system. Lenna Sciara is very supportive and encouraging to patient.  PERSONAL EMERGENCY PLAN:  Family will call 9-1-1 for  emergencies. COMMUNITY RESOURCES COORDINATION/ HEALTH CARE NAVIGATION:  Lenna Sciara helps manage patient's care. Patient is receiving his treatment at [facility]. Patient had an appointment at with MD yesterday, following a scan and they did find some small growths but Melissa said that they noted that the tumors were "maintaining". Patient's next immunotherapy appointment is on 3/10.  FINANCIAL/LEGAL CONCERNS/INTERVENTIONS:  Patient is receiving SSDI, waiting for his Medicare benefits to begin. Patient does have insurance through [employer]. Patient is also enrolled in [place] care program. Lenna Sciara noted financial stress and credit card debt. Lenna Sciara is working, when she is able, but she is the primary caregiver for patient.       SOCIAL HX:  Social History   Tobacco Use   Smoking status: Current Some Day Smoker    Packs/day: 0.25    Years: 26.00    Pack years: 6.50    Types: Cigarettes    Start date: 10/23/1989   Smokeless tobacco: Current User    Types: Snuff   Tobacco comment: smokes about 10 cigarettes every 2 weeks.   Substance Use Topics   Alcohol use: No    Alcohol/week: 0.0 - 7.0 standard drinks    Comment: Occasional use    CODE STATUS:   Code Status: Not on file (DNR) ADVANCED DIRECTIVES: Y -requested copy. MOST FORM COMPLETE:  Yes. HOSPICE EDUCATION PROVIDED: None.  PPS: Patient is mostly independent of ADLs.  Due to the COVID-19 , this visit was done via telephone from my office and it was initiated and consent by this patient and/or family. This was a scheduled visit.  I spent 60 minutes with patient/family, from  11:30a-12:30p providing education, support and consultation.   Margaretmary Lombard, LCSW

## 2019-02-25 ENCOUNTER — Other Ambulatory Visit: Payer: PRIVATE HEALTH INSURANCE | Admitting: Adult Health Nurse Practitioner

## 2019-02-25 ENCOUNTER — Other Ambulatory Visit: Payer: Self-pay

## 2019-02-25 DIAGNOSIS — Z515 Encounter for palliative care: Secondary | ICD-10-CM

## 2019-02-25 DIAGNOSIS — C649 Malignant neoplasm of unspecified kidney, except renal pelvis: Secondary | ICD-10-CM

## 2019-02-25 NOTE — Progress Notes (Signed)
[place] Consult Note Telephone: [phone]  Fax: [phone]  PATIENT NAME: Shawn Estrada DOB: 09-Jul-1975 MRN: [id number]  PRIMARY CARE PROVIDER:   Prince Solian, MD  REFERRING PROVIDER:   Dr. Lawanna Kobus  RESPONSIBLE PARTY:   Self [phone] [name], significant other, [phone]     RECOMMENDATIONS and PLAN:  1.  Advanced care planning.  Patient is a DNR. Patient and fiancee had HCPOA filled out and notarized.  Sonny Dandy, is named as his HCPOA.  Scanned and uploaded to [facility].  2.  Renal cell carcinoma with mets.  Patient had recent scan and showed some progression of liver and adrenal tumors and bone mets.  Dr. Iona Beard wants to continue the Cabometyx and Divo treatment.  Patient wants to continue with treatment.  Is scheduled for oncology appointment and bone scan on 03/20/2019.  Continue follow up  and recommendations by oncology.  3.  Pain.  Has generalized pain mostly in the abdomen almost daily but not bad enough to take something for it.  Did have pain over the weekend in his right lower leg that he described as bone pain.  He did take his PRN dilaudid for this with relief.  Does not like to take the dilaudid because it causes itching and nausea.  Suggested taking allergy medicine such as benadryl and his zofran 30 minutes prior to taking his dilaudid.  Benadryl makes him sleepy suggested trying an allergy medicine that is non drowsy.   Also suggested using hydrocortisone cream once the itching started if allergy medicine is too sedating as patient has quite a bit of fatigue.  4. Fatigue. Patient endorses that this is his most troublesome symptom.  He sleeps approximately 50% of the time.  He has to take several rest breaks when doing activities.  He is independent of ADLs and IADLs.  Has restarting using his CPAP and states that he really hasn't noticed a  difference in his fatigue or activity level.  Continue using CPAP and taking frequent rest breaks.  Palliative care will continue to monitor for symptom management/decline and make recommendations as needed.  Will call after 03/20/2019 bone scan to set up next appointment.   I spent 50 minutes providing this consultation,  including time with patient/family, chart review, provider coordination, and documentation. More than 50% of the time in this consultation was spent coordinating communication.   HISTORY OF PRESENT ILLNESS:  Shawn Estrada is a 44 y.o. year old male with multiple medical problems including renal cell carcinoma, DMT1, HTN, GERD, sleep apnea, adrenal insufficiency. Palliative Care was asked to help address goals of care.   CODE STATUS: DNR  PPS: 70% HOSPICE ELIGIBILITY/DIAGNOSIS: TBD  PHYSICAL EXAM:   General: patient at home in NAD Extremities: no edema, no joint deformities Skin: no rashes on exposed skin Neurological: Weakness but otherwise nonfocal   PAST MEDICAL HISTORY:  Past Medical History:  Diagnosis Date  . Adrenal insufficiency ([place])    secondary  . Cancer ([place])    kidney  . Cancer determined by renal biopsy ([place])   . Complication of anesthesia    slow to wake up  . Depression   . Diabetes mellitus without complication ([place])   . GERD (gastroesophageal reflux disease)   . Hypertension    No meds prescribed at this time  . PONV (postoperative nausea and vomiting)   . Sleep apnea    cpap    SOCIAL HX:  Social History  Tobacco Use  . Smoking status: Current Some Day Smoker    Packs/day: 0.25    Years: 26.00    Pack years: 6.50    Types: Cigarettes    Start date: 10/23/1989  . Smokeless tobacco: Current User    Types: Snuff  . Tobacco comment: smokes about 10 cigarettes every 2 weeks.   Substance Use Topics  . Alcohol use: No    Alcohol/week: 0.0 - 7.0 standard drinks    Comment: Occasional use    ALLERGIES:  Allergies  Allergen  Reactions  . Nivolumab Cough    Tolerates well with Benadryl pre-med.   . Lisinopril Cough  . Nsaids Other (See Comments)    Cant take nsaids due to having only 1 kidney  . Tolmetin Other (See Comments)    Cant take nsaids due to having only 1 kidney     PERTINENT MEDICATIONS:  Outpatient Encounter Medications as of 02/25/2019  Medication Sig  . alum & mag hydroxide-simeth (MAALOX/MYLANTA) 200-200-20 MG/5ML suspension Take 15 mLs by mouth every 6 (six) hours as needed for indigestion or heartburn.  . cabozantinib (CABOMETYX) 40 MG tablet Take 40 mg by mouth daily. Take on an empty stomach, 1 hour before or 2 hours after meals.  . calcium carbonate (TUMS - DOSED IN MG ELEMENTAL CALCIUM) 500 MG chewable tablet Chew 1 tablet by mouth 2 (two) times daily as needed for indigestion or heartburn.  . diphenoxylate-atropine (LOMOTIL) 2.5-0.025 MG tablet Take 1-2 tablets by mouth 4 (four) times daily.  Marland Kitchen escitalopram (LEXAPRO) 5 MG tablet Take 10 mg by mouth.   . famotidine (PEPCID) 40 MG tablet Take 40 mg by mouth 2 (two) times daily.  Marland Kitchen HUMALOG KWIKPEN 100 UNIT/ML KiwkPen Inject 20 Units into the skin 3 (three) times daily with meals. 28 with breakfast, 28 with lunch, 24 with dinner plus correction scale with each meal over 150  . hydrocortisone (CORTEF) 5 MG tablet Take 10-25 mg by mouth 2 (two) times daily. 20 mg at 8 am and 20 mg at 2 pm. Sick day rules to double dose  . loperamide (IMODIUM) 2 MG capsule Take 2 mg by mouth 4 (four) times daily.  Marland Kitchen losartan (COZAAR) 25 MG tablet Take 100 mg by mouth daily.   . ondansetron (ZOFRAN) 4 MG tablet Take 4 mg by mouth every 8 (eight) hours as needed for nausea or vomiting.  . sucralfate (CARAFATE) 1 g tablet Take 1 g by mouth as needed.  . TRESIBA FLEXTOUCH 100 UNIT/ML SOPN FlexTouch Pen Inject 52 Units into the skin daily.   . TRULICITY 1.5 0000000 SOPN Inject 1.5 mg into the skin once a week.   No facility-administered encounter medications on file  as of 02/25/2019.     Ger Ringenberg Jenetta Downer, NP

## 2019-03-25 ENCOUNTER — Telehealth: Payer: Self-pay | Admitting: Adult Health Nurse Practitioner

## 2019-03-25 DIAGNOSIS — Z515 Encounter for palliative care: Secondary | ICD-10-CM

## 2019-03-25 DIAGNOSIS — C649 Malignant neoplasm of unspecified kidney, except renal pelvis: Secondary | ICD-10-CM

## 2019-03-25 NOTE — Telephone Encounter (Signed)
Called to see if wanted to schedule follow up appointment.  Left VM with reason for call and call back number. Lindel Marcell K. Olena Heckle NP

## 2019-03-26 NOTE — Telephone Encounter (Signed)
Fiancee returned call and stated that patient doing well and condition mostly unchanged.  States that bone scan did not show mets to the bone, which was reassuring.  Celesta Gentile did say that he and she were having separation from family after his prognosis and this has been an emotional toll on them.  States that friends have been great and supportive but family has not been very supportive.  Discussed at length that this could be due to them not knowing how to deal with his prognosis.  She is interested in some kind of counseling with the family but unsure that they would be willing to participate.  States that she has SW's number and will give her a call.  Have reached out to SW to let her what is going on. Everitt Wenner K. Olena Heckle NP  Due to the COVID-19 crisis, this telephone evaluation and treatment contact was done via telephone and it was initiated and consent by this patient and or family.

## 2019-04-28 IMAGING — CT CT CHEST W/ CM
3 of 16 series · 9 of 46 positions shown, 15 images · IV contrast (iopamidol)
Comparison: PET-CT 04/29/2016. CT of the chest, abdomen and pelvis
03/26/2016.

CLINICAL DATA: 41-year-old male with history of right-sided renal
cell carcinoma diagnosed in March 2016 status post right
laparoscopic nephrectomy. Followup study.

EXAM:
CT CHEST, ABDOMEN, AND PELVIS WITH CONTRAST
TECHNIQUE: Multidetector CT imaging of the chest, abdomen and pelvis was
performed following the standard protocol during bolus
administration of intravenous contrast.
CONTRAST:  100mL QLA2VH-X22 IOPAMIDOL (QLA2VH-X22) INJECTION 61%

[Series 5: axial nephro · axial · 0.90mm/px · z∈[+1322,+1558]mm · 4 of 186 slices shown]
[im 27/186  soft-tissue]
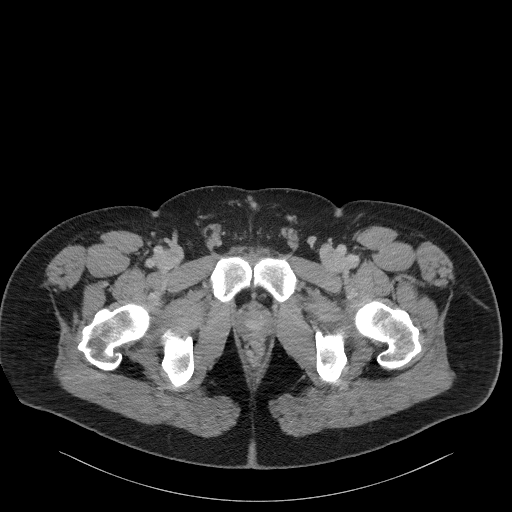
[im 53/186  soft-tissue]
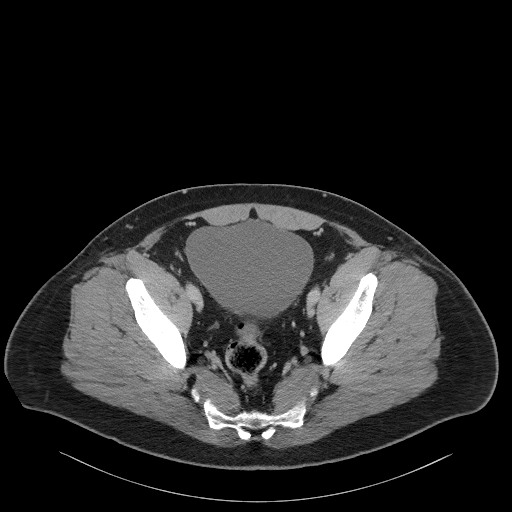
[im 80/186  soft-tissue]
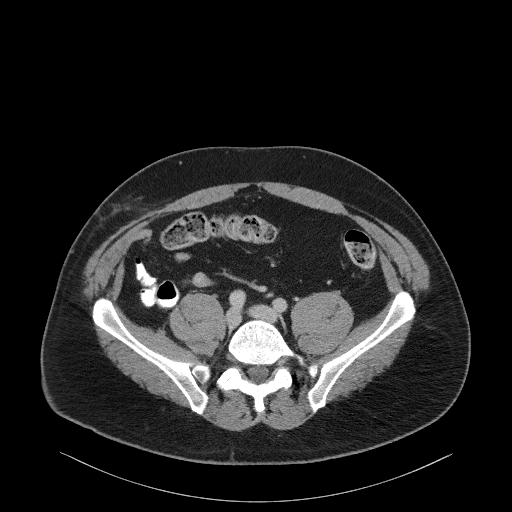
[im 106/186  soft-tissue]
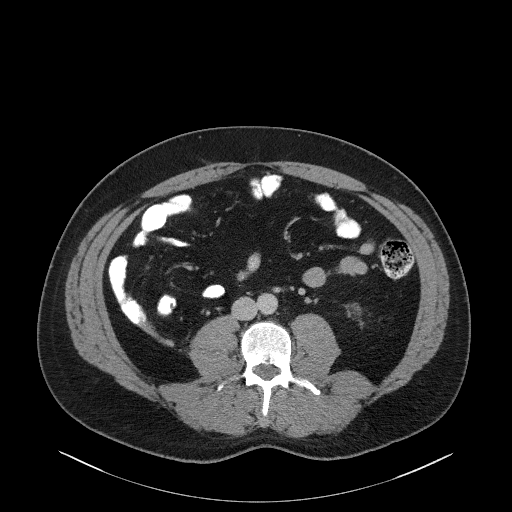

[Series 13: lung venous · axial · portal-venous · 0.90mm/px · z∈[+1732,+1900]mm · 4 of 141 slices shown, 9 images]
[im 29/141  soft-tissue]
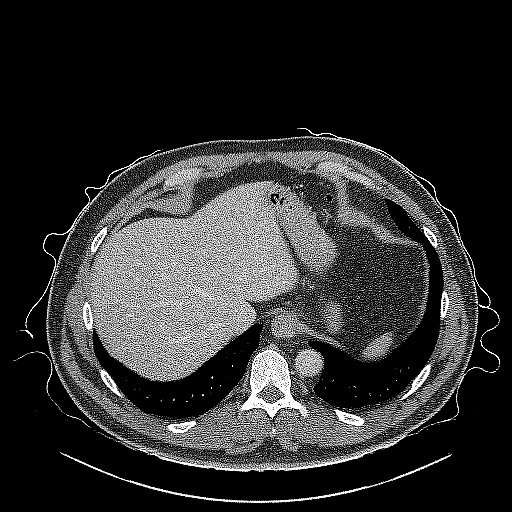
[im 29/141  lung]
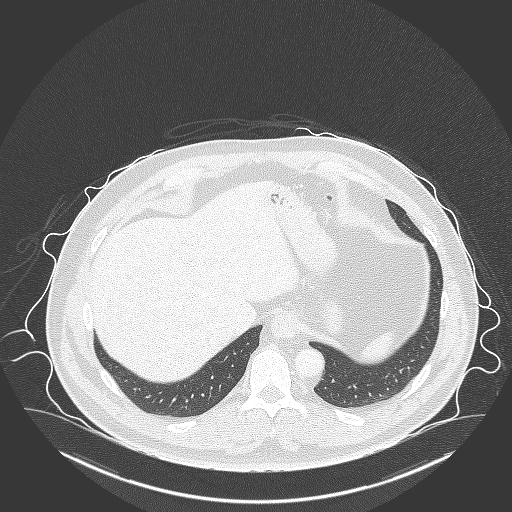
[im 29/141  bone]
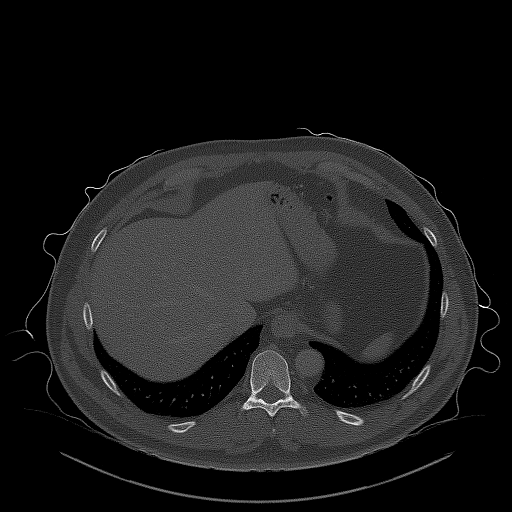
[im 57/141  soft-tissue]
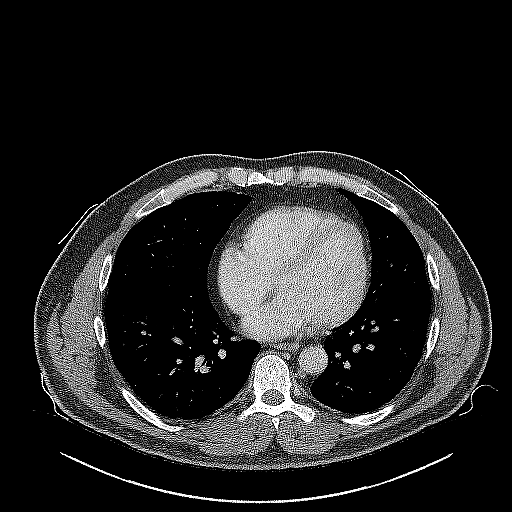
[im 57/141  lung]
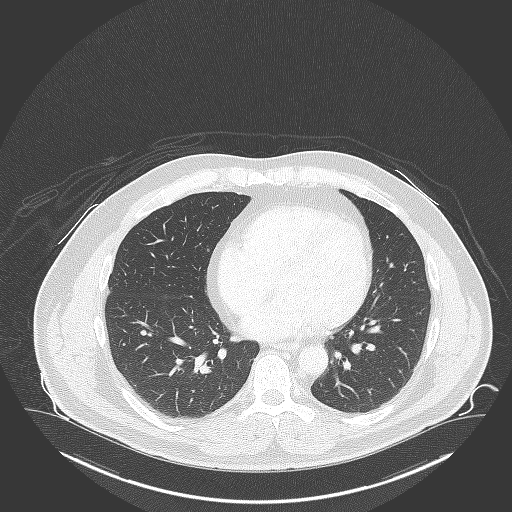
[im 85/141  soft-tissue]
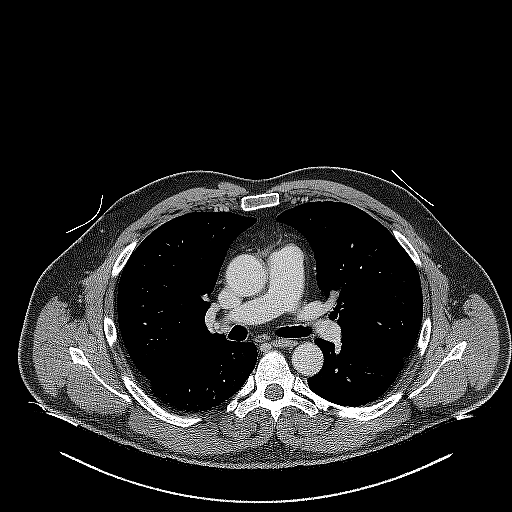
[im 85/141  lung]
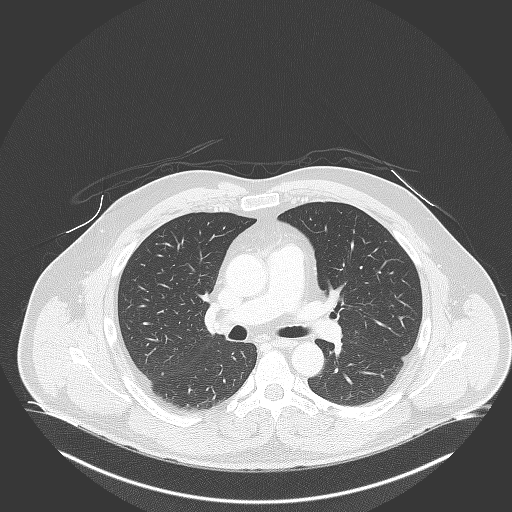
[im 113/141  soft-tissue]
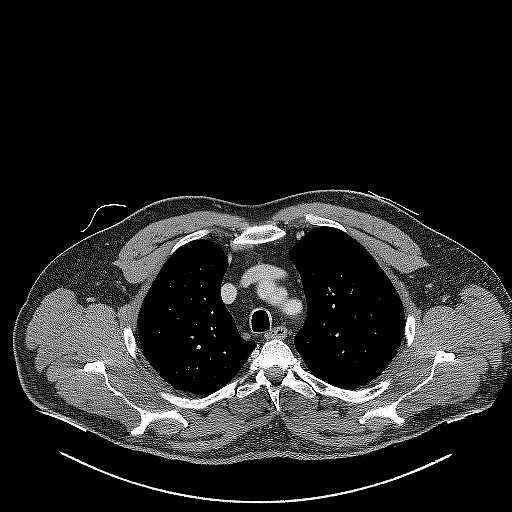
[im 113/141  lung]
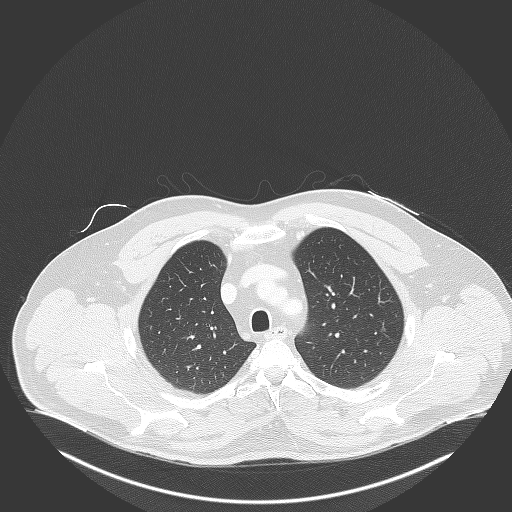

[Series 15: coronal venous · coronal · portal-venous · 0.58mm/px · 1 of 107 slices shown, 2 images]
[im 54/107  soft-tissue]
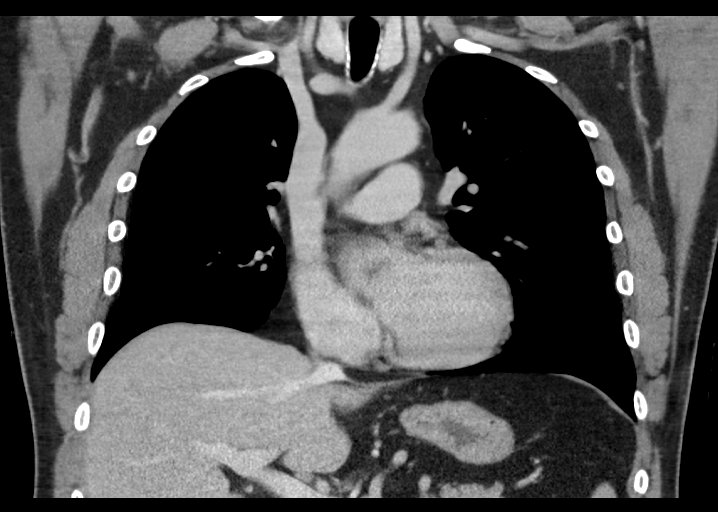
[im 54/107  bone]
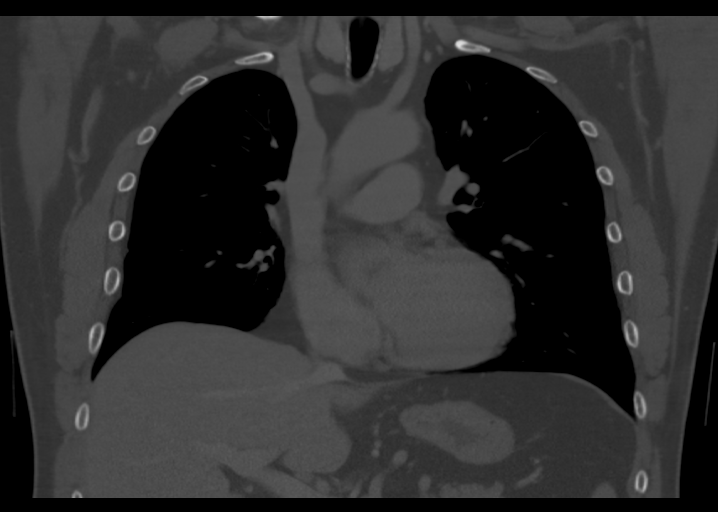

[9 of 46 positions shown; findings below may reference images not displayed]

FINDINGS: CT CHEST FINDINGS

Cardiovascular: Heart size is normal. There is no significant
pericardial fluid, thickening or pericardial calcification. No
significant atherosclerotic disease noted in the thoracic aorta. No
coronary artery calcifications.

Mediastinum/Nodes: Prominent juxtapericardial lymph node adjacent to
the cardiac apex, similar to prior studies dating back to 2277,
presumably benign. No other pathologically enlarged mediastinal or
hilar lymph nodes. Esophagus is unremarkable in appearance. No
axillary lymphadenopathy.

Lungs/Pleura: Again noted are numerous small pulmonary nodules
scattered throughout both lungs, which appear similar in size,
number and distribution to the prior examinations. The largest of
these nodules measures 12 x 5 mm in the posterior aspect of the left
upper lobe (axial image 59 of series 13). While none of these
nodules appear definitively new or have clearly enlarged compared to
the prior examinations, these nodules are definitely new compared to
remote prior study from 2277, and are presumably indicative of
metastatic disease. No acute consolidative airspace disease. No
pleural effusions.

Musculoskeletal: There are no aggressive appearing lytic or blastic
lesions noted in the visualized portions of the skeleton.

CT ABDOMEN PELVIS FINDINGS

Hepatobiliary: No cystic or solid hepatic lesions. No intra or
extrahepatic biliary ductal dilatation. Status post cholecystectomy.

Pancreas: Small hypervascular lesion in the head of the pancreas
measuring 11 x 9 mm (axial image 48 of series 3), new compared to
prior study from 03/26/2016. The remainder of the pancreas is
otherwise normal in appearance. No pancreatic ductal dilatation. No
pancreatic or peripancreatic fluid or inflammatory changes.

Spleen: Unremarkable.

Adrenals/Urinary Tract: Status post right nephrectomy. Adjacent to
the medial aspect of the nephrectomy bed there is some avidly
enhancing soft tissue measuring 1.9 x 1.2 x 3.8 cm (axial image 49
of series 3 and coronal image 69 of series 11), highly concerning
for residual disease. There is also a 7 mm area of soft tissue
nodularity and hyperenhancement along the lateral surface of the
right psoas muscle inferior to the nephrectomy bed (axial image 85
of series 3), also likely a focus of residual disease. Left kidney
is normal in appearance. New avidly enhancing left adrenal nodule
measuring 1.8 x 1.7 cm (axial image 40 of series 3), highly
concerning for an adrenal metastasis. Right adrenal gland is normal
in appearance. No left-sided hydroureteronephrosis. Urinary bladder
is grossly normal in appearance.

Stomach/Bowel: The appearance of the stomach is normal. There is no
pathologic dilatation of small bowel or colon. Normal appendix.

Vascular/Lymphatic: No significant atherosclerotic disease, aneurysm
or dissection noted in the abdominal or pelvic vasculature.
Enhancing soft tissue in the medial aspect of the right nephrectomy
bed (discussed above) could reflect residual disease or right
retroperitoneal lymphadenopathy. No additional sites of
lymphadenopathy are noted elsewhere in the abdomen or pelvis.

Reproductive: Prostate gland and seminal vesicles are unremarkable
in appearance.

Other: Several small enhancing soft tissue nodules are noted
adjacent to the hepatic flexure of the colon on axial image 49, 55
and 59 of series 3, measuring up to 1 cm, highly concerning for
peritoneal implants. No significant volume of ascites. No
pneumoperitoneum.

Musculoskeletal: There are no aggressive appearing lytic or blastic
lesions noted in the visualized portions of the skeleton.
IMPRESSION: 1. Today's study demonstrates enhancing soft tissue in the
nephrectomy bed which may represent residual/recurrent disease.
There is also adjacent enhancing nodularity superficial to the
hepatic flexure of the colon, along the surface of the right psoas
muscle, a new enhancing left adrenal nodule indicative of adrenal
metastasis, new enhancing lesion measuring 11 x 9 mm in the
pancreatic head suspicious for a pancreatic metastasis, and multiple
pulmonary nodules which are stable compared to recent prior
examinations but new compared to more remote prior studies,
presumably indicative of metastatic disease to the lungs.
2. Additional incidental findings, as above.

## 2019-05-24 ENCOUNTER — Telehealth: Payer: Self-pay | Admitting: Adult Health Nurse Practitioner

## 2019-05-24 NOTE — Telephone Encounter (Signed)
Spoke with fiancee, Melissa.  Patient has had recent changes and hospital stay.  Set up appointment on 05/27/19 at 4pm to discuss in more detail. Amy K. Olena Heckle NP

## 2019-05-27 ENCOUNTER — Other Ambulatory Visit: Payer: PRIVATE HEALTH INSURANCE | Admitting: Adult Health Nurse Practitioner

## 2019-05-27 ENCOUNTER — Other Ambulatory Visit: Payer: Self-pay

## 2019-05-27 DIAGNOSIS — Z515 Encounter for palliative care: Secondary | ICD-10-CM

## 2019-05-27 DIAGNOSIS — C649 Malignant neoplasm of unspecified kidney, except renal pelvis: Secondary | ICD-10-CM

## 2019-05-27 NOTE — Progress Notes (Signed)
[facility] Consult Note Telephone: [phone]  Fax: [phone]  PATIENT NAME: Shawn Estrada DOB: 06-Oct-1975 MRN: [id number]  PRIMARY CARE PROVIDER:   Prince Solian, MD  REFERRING PROVIDER:  Dr. Lawanna Kobus  RESPONSIBLE PARTY:   Self [phone] [name], significant other, [phone]    RECOMMENDATIONS and PLAN:  1.  Advanced care planning. Patient is a DNR.  Sonny Dandy, is named as his HCPOA.  2.  Renal cell carcinoma with mets.  Patient had recent scan showing further progression of his renal cancer.  Patient has appointment this Wednesday to further discuss chemo options.  Patient has weakness and states sleeping more often.  He has developed pressure wounds to buttocks and has seen improvement with Cicalfate cream provided by dermatologist.  He states appetite is good. Has nausea daily and takes Zofran with good relief.  Patient had ER visit on 05/21/19 in which he his BS were elevated.  Believed to be due to him not taking his hydrocortisone for his adrenal insufficiency.  His fiancee has taken over his medications and will be working with his mother to help organize his meds in pill boxes.  Went over current med list and fiancee had everything written down and when he is supposed to take everything.  Encouraged her that she has everything organized as she should and to call if she had any questions.  Continue follow up and recommendations by oncology.    3.  Pain.  Patient gets abdominal and low back pain.  Today is having a bad pain day and has been taking his dilaudid 4 mg every 4 hours.  He does not take his dilaudid every day but just when he is having a bad pain day.  His pain starts as a sharp stabbing pain that turns into a dull throbbing pain that is constant.  He is still in pain after taking 1 dilaudid 4 mg 3 hours ago.  Patient had some hydrocodone that  he has not used in several months and encouraged to try one now to see if that would help with the pain.  Suggested trying to take 2 dilaudid 4 mg if his pain was still severe when time for his next dose.  Recommend taking Dilaudid 4 mg 1-2 tabs every 4 hours as needed for pain or adding hydrocodone or oxycodone as needed for breakthrough pain.    4.  Anemia.  Patient is looking pale today.  States that while he is on chemo that his HGB/HCT look good but tend to drop when he is off chemo.  He is off chemo right now.  Discussed possibly taking iron supplement but to discuss with oncologist prior to starting.    Celesta Gentile states that she is going to message oncologist with today's recommendations and provider will fax today's note to oncologist.  Palliative will start seeing patient more often for symptom management/decline and make recommendations as needed.  Next appointment is in 3 weeks  I spent 120 minutes providing this consultation,  from 4:00 to 6:00 including time with patient/family, chart review, provider coordination, and documentation. More than 50% of the time in this consultation was spent coordinating communication.   HISTORY OF PRESENT ILLNESS:  Shawn Estrada is a 44 y.o. year old male with multiple medical problems including renal cell carcinoma, DMT1, HTN, GERD, sleep apnea, adrenal insufficiency. Palliative Care was asked to help address goals of care.   CODE STATUS: DNR  PPS: 60% HOSPICE  ELIGIBILITY/DIAGNOSIS: TBD  PHYSICAL EXAM:  BP 142/90  HR 79  O2 98% on RA General: NAD, frail appearing, Patient looks pale Cardiovascular: regular rate and rhythm Pulmonary: lung sounds clear; normal respiratory effort Extremities: no edema, no joint deformities Skin: no rashes on exposed skin Neurological: Weakness but otherwise nonfocal   PAST MEDICAL HISTORY:  Past Medical History:  Diagnosis Date  . Adrenal insufficiency ([place])    secondary  . Cancer ([place])    kidney  . Cancer  determined by renal biopsy ([place])   . Complication of anesthesia    slow to wake up  . Depression   . Diabetes mellitus without complication ([place])   . GERD (gastroesophageal reflux disease)   . Hypertension    No meds prescribed at this time  . PONV (postoperative nausea and vomiting)   . Sleep apnea    cpap    SOCIAL HX:  Social History   Tobacco Use  . Smoking status: Current Some Day Smoker    Packs/day: 0.25    Years: 26.00    Pack years: 6.50    Types: Cigarettes    Start date: 10/23/1989  . Smokeless tobacco: Current User    Types: Snuff  . Tobacco comment: smokes about 10 cigarettes every 2 weeks.   Substance Use Topics  . Alcohol use: No    Alcohol/week: 0.0 - 7.0 standard drinks    Comment: Occasional use    ALLERGIES:  Allergies  Allergen Reactions  . Nivolumab Cough    Tolerates well with Benadryl pre-med.   . Lisinopril Cough  . Nsaids Other (See Comments)    Cant take nsaids due to having only 1 kidney  . Tolmetin Other (See Comments)    Cant take nsaids due to having only 1 kidney     PERTINENT MEDICATIONS:  Outpatient Encounter Medications as of 05/27/2019  Medication Sig  . alum & mag hydroxide-simeth (MAALOX/MYLANTA) 200-200-20 MG/5ML suspension Take 15 mLs by mouth every 6 (six) hours as needed for indigestion or heartburn.  . cabozantinib (CABOMETYX) 40 MG tablet Take 40 mg by mouth daily. Take on an empty stomach, 1 hour before or 2 hours after meals.  . calcium carbonate (TUMS - DOSED IN MG ELEMENTAL CALCIUM) 500 MG chewable tablet Chew 1 tablet by mouth 2 (two) times daily as needed for indigestion or heartburn.  . diphenoxylate-atropine (LOMOTIL) 2.5-0.025 MG tablet Take 1-2 tablets by mouth 4 (four) times daily.  Marland Kitchen escitalopram (LEXAPRO) 5 MG tablet Take 10 mg by mouth.   . famotidine (PEPCID) 40 MG tablet Take 40 mg by mouth 2 (two) times daily.  Marland Kitchen HUMALOG KWIKPEN 100 UNIT/ML KiwkPen Inject 20 Units into the skin 3 (three) times daily with  meals. 28 with breakfast, 28 with lunch, 24 with dinner plus correction scale with each meal over 150  . hydrocortisone (CORTEF) 5 MG tablet Take 10-25 mg by mouth 2 (two) times daily. 20 mg at 8 am and 20 mg at 2 pm. Sick day rules to double dose  . loperamide (IMODIUM) 2 MG capsule Take 2 mg by mouth 4 (four) times daily.  Marland Kitchen losartan (COZAAR) 25 MG tablet Take 100 mg by mouth daily.   . ondansetron (ZOFRAN) 4 MG tablet Take 4 mg by mouth every 8 (eight) hours as needed for nausea or vomiting.  . sucralfate (CARAFATE) 1 g tablet Take 1 g by mouth as needed.  . TRESIBA FLEXTOUCH 100 UNIT/ML SOPN FlexTouch Pen Inject 52 Units into the skin daily.   Marland Kitchen  TRULICITY 1.5 0000000 SOPN Inject 1.5 mg into the skin once a week.   No facility-administered encounter medications on file as of 05/27/2019.     Flynn Gwyn Jenetta Downer, NP

## 2019-06-20 ENCOUNTER — Other Ambulatory Visit: Payer: Self-pay

## 2019-06-20 ENCOUNTER — Other Ambulatory Visit: Payer: PRIVATE HEALTH INSURANCE | Admitting: Adult Health Nurse Practitioner

## 2019-06-20 DIAGNOSIS — C649 Malignant neoplasm of unspecified kidney, except renal pelvis: Secondary | ICD-10-CM

## 2019-06-20 DIAGNOSIS — Z515 Encounter for palliative care: Secondary | ICD-10-CM

## 2019-06-20 NOTE — Progress Notes (Signed)
[facility] Consult Note Telephone: [phone]  Fax: [phone]  PATIENT NAME: Shawn Estrada DOB: [birth year] MRN: [id number]  PRIMARY CARE PROVIDER:   Prince Solian, MD  REFERRING PROVIDER:  Dr. Lawanna Kobus  RESPONSIBLE PARTY:   Self [phone] [name], significant other, [phone]    RECOMMENDATIONS and PLAN:  1.  Advanced care planning. Patient is a DNR.  Sonny Dandy, is named as his HCPOA.  2.  Renal cell carcinoma with mets.  Patient still undergoing immunotherapy every 4 weeks and Cabometyx 40 mg daily.  Ellene Route states that patient has good days and bad days.  Today is a bad day in which she has been sleeping all day and has not had anything to eat or has taken his medications.  She states that he has been really good at taking all of his medications as prescribed until today.  Patient does get up while providers there.  Patient does get a little upset with fianc when she tries to help him with his medications.  Discussed with patient and fianc that it is okay to be frustrated with everything that is going on patient is losing independence as he becomes weaker and is needing more help with ADLs.  Is able to bathe and dress himself but this tires him out.  Inquired if he would need an aide to help him with showering so he could conserve his energy.  States that he does not need that at this point.  Have encouraged them to get a shower chair so that he can sit down in the shower so he can take rest breaks to help conserve his energy.  Ellene Route states that he has become at an increased fall risk and he does not stay by himself anymore.  Fianc and patient's mother take turns staying with him.  Did provide emotional support for patient as he feels frustrated and angry with the loss of independence.  Also provided emotional support for her fianc as she admits to feeling  scared that she is not helping him the best way that she can.  Encouraged her that she is helping him in every way that she possibly can.  Discussed the weakness and fatigue and side effects related to his disease and treatment and that if he wanted to he could stop treatments.  Patient does have lab work next week.  Have appointment in 2 weeks to further discuss options after getting results of lab work.  Palliative care will continue to monitor for symptom management/decline and make recommendations as needed.  Next appointment is in 2 weeks.  I spent 90 minutes providing this consultation,  from 3:30 to 5:00 including time with patient/family, chart review, provider coordination, and documentation. More than 50% of the time in this consultation was spent coordinating communication.   HISTORY OF PRESENT ILLNESS:  Shawn Estrada is a 44 y.o. year old male with multiple medical problems including renal cell carcinoma, DMT1, HTN, GERD, sleep apnea, adrenal insufficiency. Palliative Care was asked to help address goals of care.   CODE STATUS: DNR  PPS: 50% HOSPICE ELIGIBILITY/DIAGNOSIS: TBD  PHYSICAL EXAM:   General: NAD, frail appearing Extremities: no edema, no joint deformities Skin: no rashes on exposed skin Neurological: Weakness but otherwise nonfocal   PAST MEDICAL HISTORY:  Past Medical History:  Diagnosis Date  . Adrenal insufficiency ([place])    secondary  . Cancer ([place])    kidney  . Cancer determined by renal biopsy (  [place])   . Complication of anesthesia    slow to wake up  . Depression   . Diabetes mellitus without complication ([place])   . GERD (gastroesophageal reflux disease)   . Hypertension    No meds prescribed at this time  . PONV (postoperative nausea and vomiting)   . Sleep apnea    cpap    SOCIAL HX:  Social History   Tobacco Use  . Smoking status: Current Some Day Smoker    Packs/day: 0.25    Years: 26.00    Pack years: 6.50    Types: Cigarettes    Start  date: 10/23/1989  . Smokeless tobacco: Current User    Types: Snuff  . Tobacco comment: smokes about 10 cigarettes every 2 weeks.   Substance Use Topics  . Alcohol use: No    Alcohol/week: 0.0 - 7.0 standard drinks    Comment: Occasional use    ALLERGIES:  Allergies  Allergen Reactions  . Nivolumab Cough    Tolerates well with Benadryl pre-med.   . Lisinopril Cough  . Nsaids Other (See Comments)    Cant take nsaids due to having only 1 kidney  . Tolmetin Other (See Comments)    Cant take nsaids due to having only 1 kidney     PERTINENT MEDICATIONS:  Outpatient Encounter Medications as of 06/20/2019  Medication Sig  . alum & mag hydroxide-simeth (MAALOX/MYLANTA) 200-200-20 MG/5ML suspension Take 15 mLs by mouth every 6 (six) hours as needed for indigestion or heartburn.  . cabozantinib (CABOMETYX) 40 MG tablet Take 40 mg by mouth daily. Take on an empty stomach, 1 hour before or 2 hours after meals.  . calcium carbonate (TUMS - DOSED IN MG ELEMENTAL CALCIUM) 500 MG chewable tablet Chew 1 tablet by mouth 2 (two) times daily as needed for indigestion or heartburn.  . diphenoxylate-atropine (LOMOTIL) 2.5-0.025 MG tablet Take 1-2 tablets by mouth 4 (four) times daily.  Marland Kitchen escitalopram (LEXAPRO) 5 MG tablet Take 10 mg by mouth.   . famotidine (PEPCID) 40 MG tablet Take 40 mg by mouth 2 (two) times daily.  Marland Kitchen HUMALOG KWIKPEN 100 UNIT/ML KiwkPen Inject 20 Units into the skin 3 (three) times daily with meals. 28 with breakfast, 28 with lunch, 24 with dinner plus correction scale with each meal over 150  . hydrocortisone (CORTEF) 5 MG tablet Take 10-25 mg by mouth 2 (two) times daily. 20 mg at 8 am and 20 mg at 2 pm. Sick day rules to double dose  . loperamide (IMODIUM) 2 MG capsule Take 2 mg by mouth 4 (four) times daily.  Marland Kitchen losartan (COZAAR) 25 MG tablet Take 100 mg by mouth daily.   . ondansetron (ZOFRAN) 4 MG tablet Take 4 mg by mouth every 8 (eight) hours as needed for nausea or vomiting.    . sucralfate (CARAFATE) 1 g tablet Take 1 g by mouth as needed.  . TRESIBA FLEXTOUCH 100 UNIT/ML SOPN FlexTouch Pen Inject 52 Units into the skin daily.   . TRULICITY 1.5 PP/5.0DT SOPN Inject 1.5 mg into the skin once a week.   No facility-administered encounter medications on file as of 06/20/2019.     Javeria Briski Jenetta Downer, NP

## 2019-07-01 ENCOUNTER — Other Ambulatory Visit: Payer: PRIVATE HEALTH INSURANCE | Admitting: Adult Health Nurse Practitioner

## 2019-07-01 ENCOUNTER — Other Ambulatory Visit: Payer: Self-pay

## 2019-07-02 ENCOUNTER — Telehealth: Payer: Self-pay | Admitting: Adult Health Nurse Practitioner

## 2019-07-02 NOTE — Telephone Encounter (Signed)
This is late entry. Returned fiancee's message about cancelling visit. Supposed to have visit on 07/01/19 at [facility] stated that he is feeling better and did not have to use any pain medication since our last visit a couple weeks ago.  He is enjoying some time at the beach while he is feeling good.  Instructed to call when he gets back in town to schedule next appointment Iveth Heidemann K. Olena Heckle NP

## 2019-09-21 ENCOUNTER — Telehealth: Payer: Self-pay | Admitting: Adult Health Nurse Practitioner

## 2019-09-21 NOTE — Telephone Encounter (Signed)
Spoke with patient after receiving phone call from SW that he may be interested in hospice services.  Patient states that he is still receiving chemo and is not interested in stopping at this time.  He is currently at the beach and asks that I speak with his fiancee, Melissa.  Left VM for Melissa with reason for call and call back info. Abrey Bradway K. Olena Heckle NP

## 2019-10-24 ENCOUNTER — Encounter: Payer: Self-pay | Admitting: Emergency Medicine

## 2019-10-24 ENCOUNTER — Emergency Department
Admission: EM | Admit: 2019-10-24 | Discharge: 2019-10-25 | Disposition: A | Discharge status: Home / Self Care | Payer: Medicare Other | Attending: Emergency Medicine | Admitting: Emergency Medicine

## 2019-10-24 ENCOUNTER — Other Ambulatory Visit: Payer: Self-pay

## 2019-10-24 DIAGNOSIS — Z85528 Personal history of other malignant neoplasm of kidney: Secondary | ICD-10-CM | POA: Diagnosis not present

## 2019-10-24 DIAGNOSIS — Z5321 Procedure and treatment not carried out due to patient leaving prior to being seen by health care provider: Secondary | ICD-10-CM | POA: Insufficient documentation

## 2019-10-24 DIAGNOSIS — R531 Weakness: Secondary | ICD-10-CM | POA: Diagnosis not present

## 2019-10-24 LAB — BASIC METABOLIC PANEL
Anion gap: 17 — ABNORMAL HIGH (ref 5–15)
BUN: 17 mg/dL (ref 6–20)
CO2: 23 mmol/L (ref 22–32)
Calcium: 9.4 mg/dL (ref 8.9–10.3)
Chloride: 95 mmol/L — ABNORMAL LOW (ref 98–111)
Creatinine, Ser: 1.43 mg/dL — ABNORMAL HIGH (ref 0.61–1.24)
GFR, Estimated: 58 mL/min — ABNORMAL LOW (ref >60)
Glucose, Bld: 278 mg/dL — ABNORMAL HIGH (ref 70–99)
Potassium: 4.5 mmol/L (ref 3.5–5.1)
Sodium: 135 mmol/L (ref 135–145)

## 2019-10-24 LAB — CBC
HCT: 42.8 % (ref 39.0–52.0)
Hemoglobin: 14.1 g/dL (ref 13.0–17.0)
MCH: 31.4 pg (ref 26.0–34.0)
MCHC: 32.9 g/dL (ref 30.0–36.0)
MCV: 95.3 fL (ref 80.0–100.0)
Platelets: 431 10*3/uL — ABNORMAL HIGH (ref 150–400)
RBC: 4.49 MIL/uL (ref 4.22–5.81)
RDW: 13.3 % (ref 11.5–15.5)
WBC: 9 10*3/uL (ref 4.0–10.5)
nRBC: 0 % (ref 0.0–0.2)

## 2019-10-24 LAB — TROPONIN I (HIGH SENSITIVITY): Troponin I (High Sensitivity): 6 ng/L (ref <18)

## 2019-10-24 NOTE — ED Triage Notes (Signed)
Pt comes into the ED via [facility] from home c/o generalized weakness.  Pt has stage 4 renal carcinoma and currently is treating him and is a patient of hospice.  Pt neurologically intact at this time.  VSS, CBG 162.

## 2019-10-25 NOTE — ED Notes (Signed)
No answer when called from lobby 

## 2019-10-29 ENCOUNTER — Telehealth: Payer: Self-pay | Admitting: Adult Health Nurse Practitioner

## 2019-10-29 NOTE — Telephone Encounter (Signed)
Spoke with patient's fiancee, Melissa.  She states that he has told her and his mom that he wants hospice.  He did say over the phone that he wanted to stop chemo and start hospice. He is getting weaker and is sleeping more.  Sometimes will sleep for 2-3 days.  Has lost 12 pounds over the past 2 months.   Spoke with Dr. Iona Beard who is in agreement with hospice and gives the VO for hospice referral and agrees to be attending.  Have reached out referral intake to let them know of VO for hospice. Emma Birchler K. Olena Heckle NP

## 2019-12-11 DEATH — deceased
